# Patient Record
Sex: Female | Born: 1955 | Race: White | Hispanic: No | Marital: Married | Smoking: Never smoker
Health system: Southern US, Community
[De-identification: ages and names within clinical notes are randomized; demographics above are authoritative.]

## PROBLEM LIST (undated history)

## (undated) DIAGNOSIS — Z789 Other specified health status: Secondary | ICD-10-CM

---

## 2004-03-14 ENCOUNTER — Ambulatory Visit: Payer: Self-pay | Admitting: Unknown Physician Specialty

## 2005-04-16 ENCOUNTER — Ambulatory Visit: Payer: Self-pay | Admitting: Unknown Physician Specialty

## 2005-09-05 ENCOUNTER — Ambulatory Visit: Payer: Self-pay | Admitting: Unknown Physician Specialty

## 2005-09-29 ENCOUNTER — Other Ambulatory Visit: Payer: Self-pay

## 2005-10-09 ENCOUNTER — Ambulatory Visit: Payer: Self-pay | Admitting: Unknown Physician Specialty

## 2006-04-22 ENCOUNTER — Ambulatory Visit: Payer: Self-pay | Admitting: Unknown Physician Specialty

## 2006-07-01 ENCOUNTER — Ambulatory Visit: Payer: Self-pay | Admitting: Unknown Physician Specialty

## 2008-01-25 ENCOUNTER — Ambulatory Visit: Payer: Self-pay | Admitting: Unknown Physician Specialty

## 2009-04-27 ENCOUNTER — Ambulatory Visit: Payer: Self-pay | Admitting: Unknown Physician Specialty

## 2010-05-01 ENCOUNTER — Ambulatory Visit: Payer: Self-pay | Admitting: Unknown Physician Specialty

## 2011-05-13 ENCOUNTER — Ambulatory Visit: Payer: Self-pay | Admitting: Unknown Physician Specialty

## 2011-09-03 ENCOUNTER — Ambulatory Visit: Payer: Self-pay | Admitting: Urology

## 2011-09-26 ENCOUNTER — Ambulatory Visit: Payer: Self-pay | Admitting: Urology

## 2011-10-02 ENCOUNTER — Ambulatory Visit: Payer: Self-pay | Admitting: Urology

## 2011-10-08 ENCOUNTER — Ambulatory Visit: Payer: Self-pay | Admitting: Urology

## 2011-10-15 ENCOUNTER — Ambulatory Visit: Payer: Self-pay | Admitting: Urology

## 2012-06-11 ENCOUNTER — Ambulatory Visit: Payer: Self-pay | Admitting: Physician Assistant

## 2013-04-11 IMAGING — CT CT STONE STUDY
1 of 2 series · 15 of 32 positions shown, 19 images · non-contrast
Comparison: none

REASON FOR EXAM: kidney stones on xray hematuria renal colic
COMMENTS:

PROCEDURE:     KCT - KCT ABDOMEN/PELVIS WO (STONE)  - September 26, 2011  [DATE]
RESULT:     Comparison: None
TECHNIQUE: Multiple axial images from the lung bases to the symphysis pubis
were obtained without oral and without intravenous contrast.

[Series 2: abd 3mm wo 3.0 i40f 3 · axial · 0.98mm/px · z∈[-1140,-714]mm · 15 of 160 slices shown, 19 images]
[im 12/160  soft-tissue]
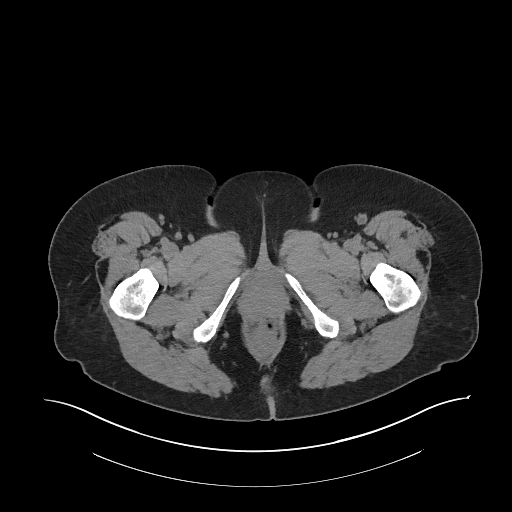
[im 12/160  bone]
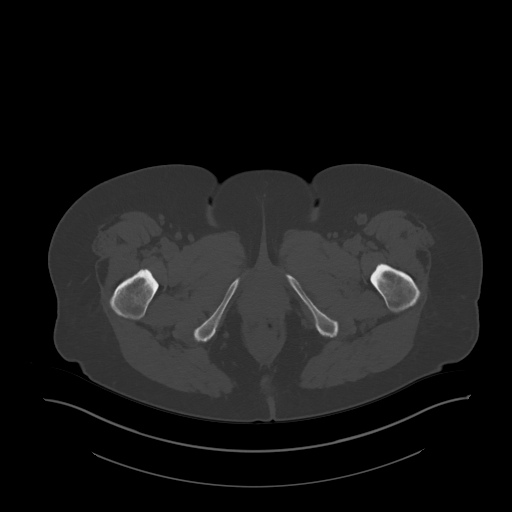
[im 24/160  soft-tissue]
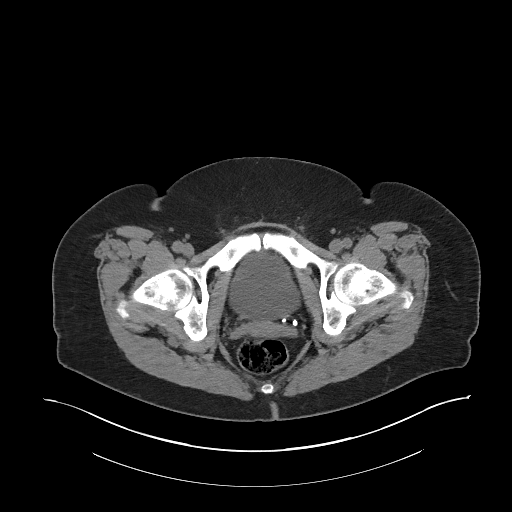
[im 36/160  soft-tissue]
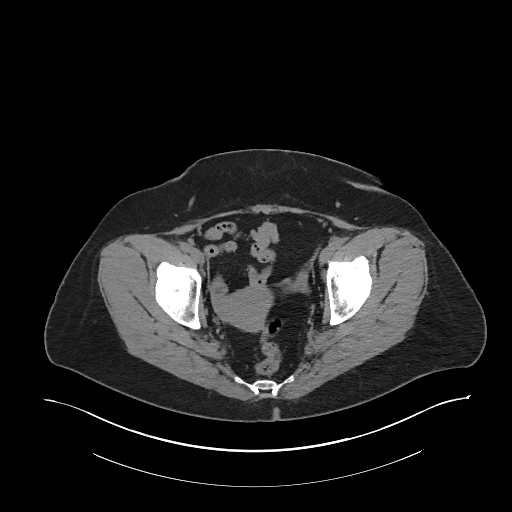
[im 48/160  soft-tissue]
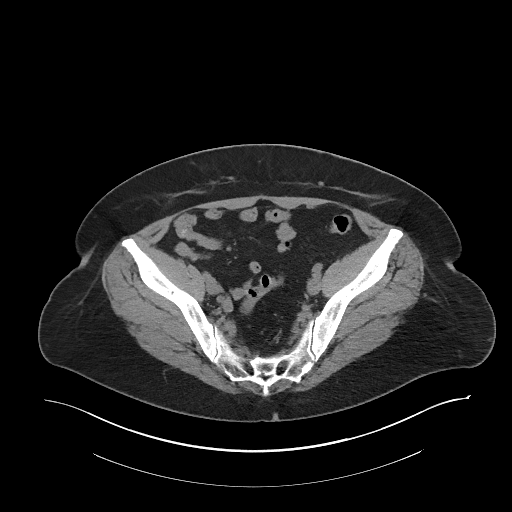
[im 59/160  soft-tissue]
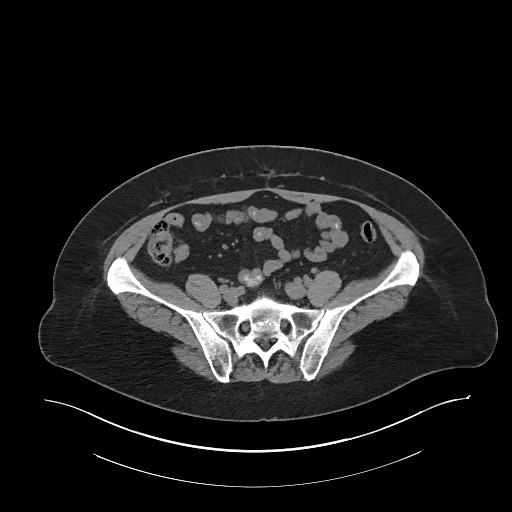
[im 71/160  soft-tissue]
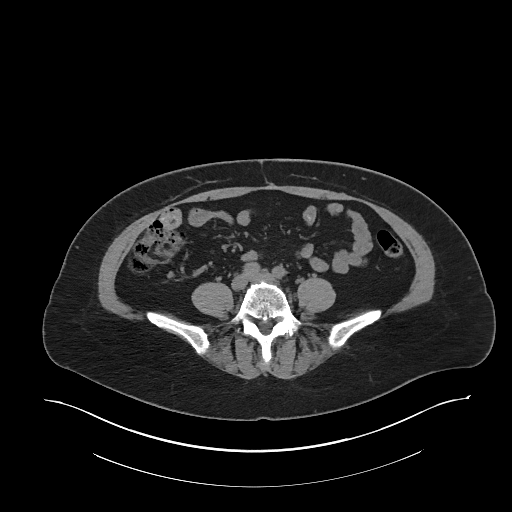
[im 83/160  soft-tissue]
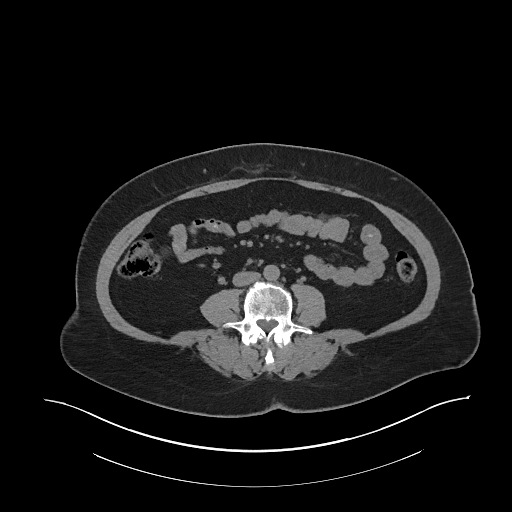
[im 95/160  soft-tissue]
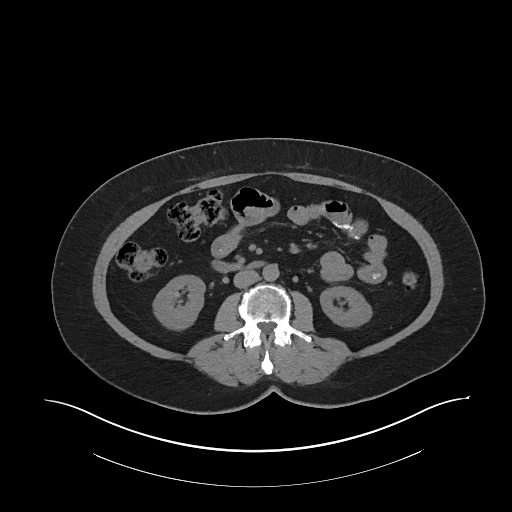
[im 107/160  soft-tissue]
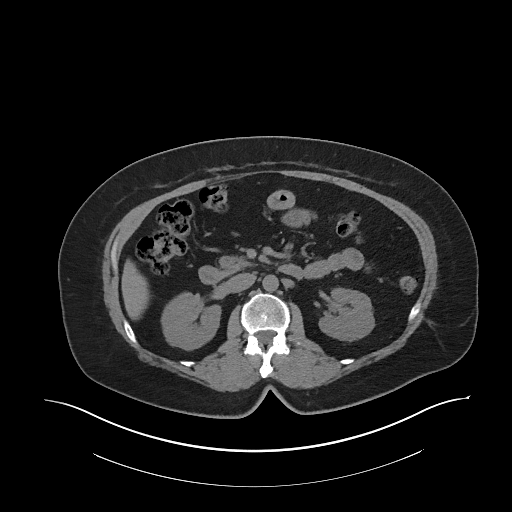
[im 107/160  bone]
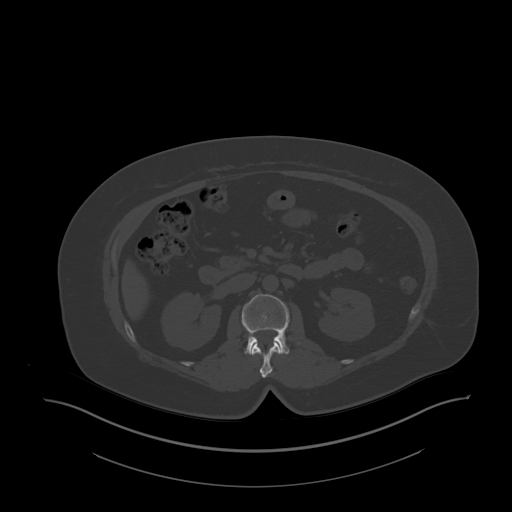
[im 118/160  soft-tissue]
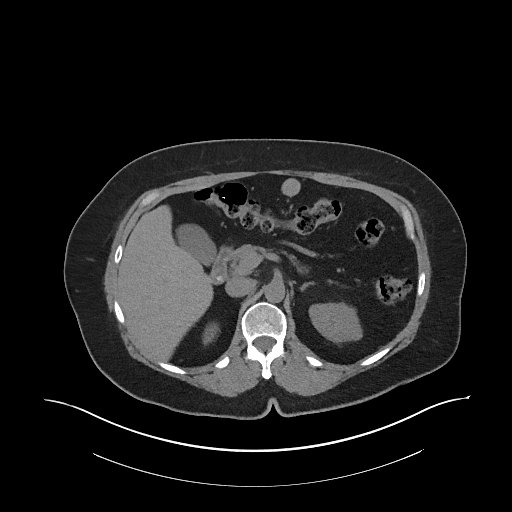
[im 130/160  soft-tissue]
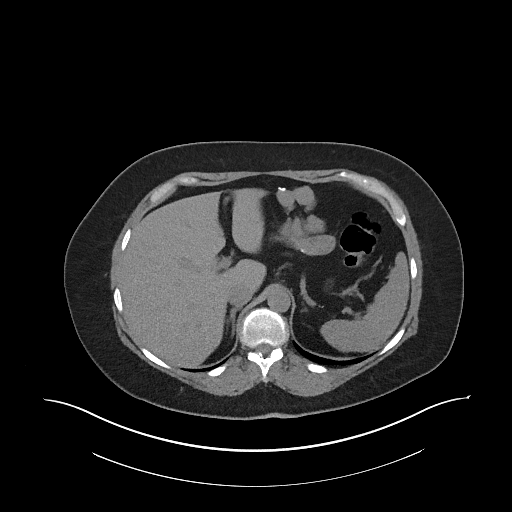
[im 136/160  lung]
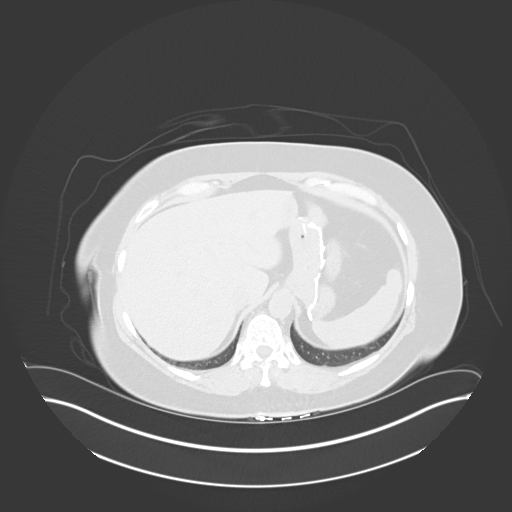
[im 142/160  soft-tissue]
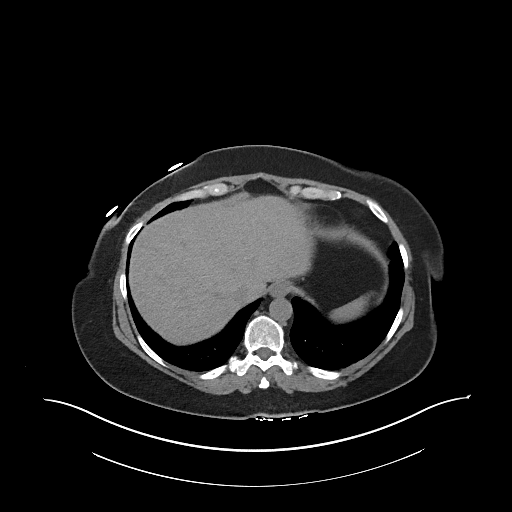
[im 142/160  lung]
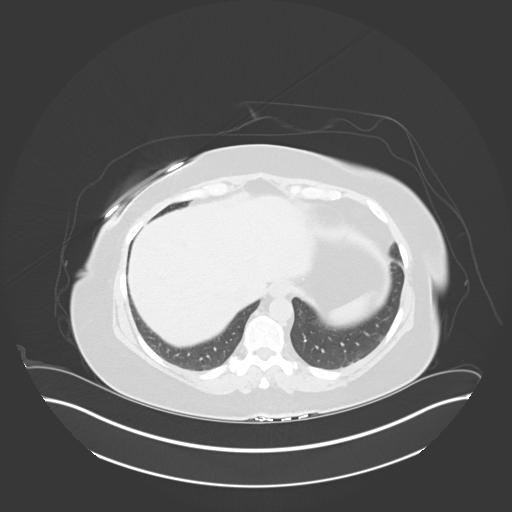
[im 148/160  lung]
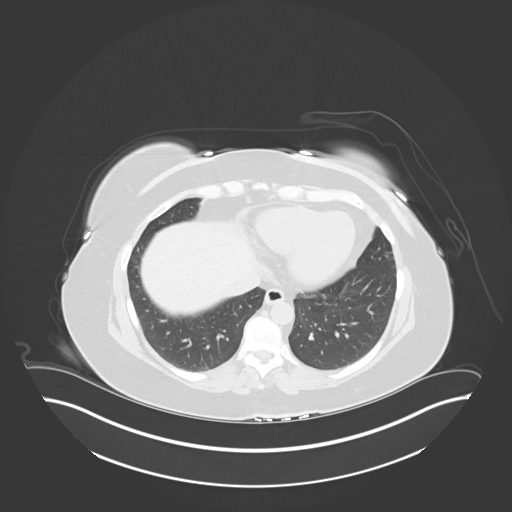
[im 154/160  soft-tissue]
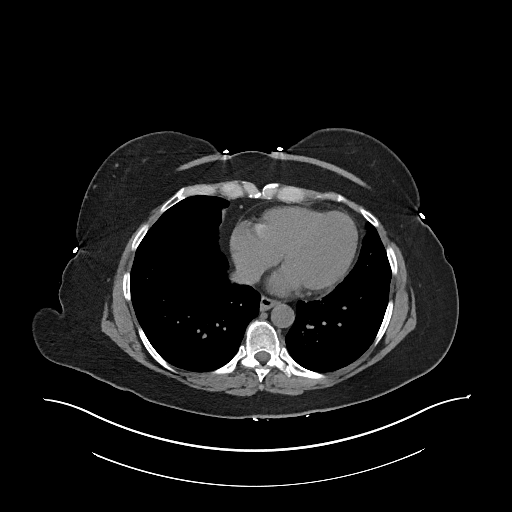
[im 154/160  lung]
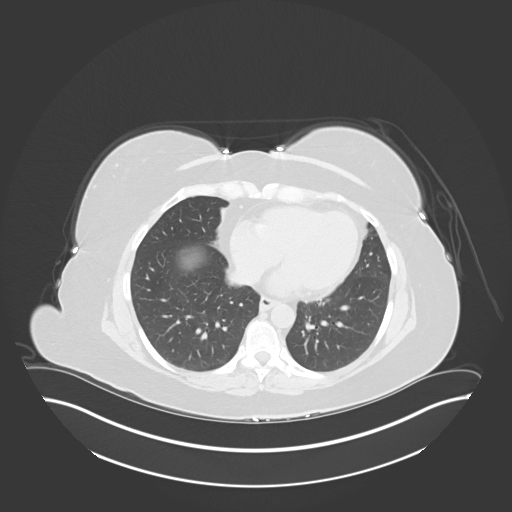

[15 of 32 positions shown; findings below may reference images not displayed]

FINDINGS: Lack of intravenous contrast limits evaluation of the solid abdominal
organs.  Grossly, the liver, spleen, adrenals, pancreas, and gallbladder are
unremarkable. Postoperative changes are seen likely from prior gastric
bypass. There are bilateral renal calculi, right greater than left. The
largest is seen in the right kidney. The largest measures 1.1 x 0.8 cm near
the renal pelvis the right kidney. There is no hydronephrosis or
ureterectasis. There is mild lobulation of the fetal contours bilaterally.

The small and large bowel are normal in caliber. There are a few diverticula
in the sigmoid colon. The appendix is normal. There is a focal area of
circumferential bowel wall thickening in the jejunal loop just distal to the
gastrojejunostomy, as seen on image 50. There are no adjacent inflammatory
changes. This is of uncertain etiology and clinical significance.

No aggressive lytic or sclerotic osseous lesions are identified.
IMPRESSION: 1. Bilateral nephrolithiasis, without hydronephrosis.
2. Focal circumferential bowel wall thickening in the jejunal loop just
distal to the gastrojejunostomy. This is nonspecific and could be infectious
or inflammatory. A neoplastic etiology would be difficult to exclude.
Followup CT of the abdomen and pelvis with oral contrast is suggested to
ensure resolution. Further evaluation with direct visualization may be
beneficial.

## 2013-04-23 IMAGING — CR DG ABDOMEN 1V
1 series · 1 of 1 positions shown · non-contrast
Comparison: none

REASON FOR EXAM: renal colic
COMMENTS:

[t abdomen supine]
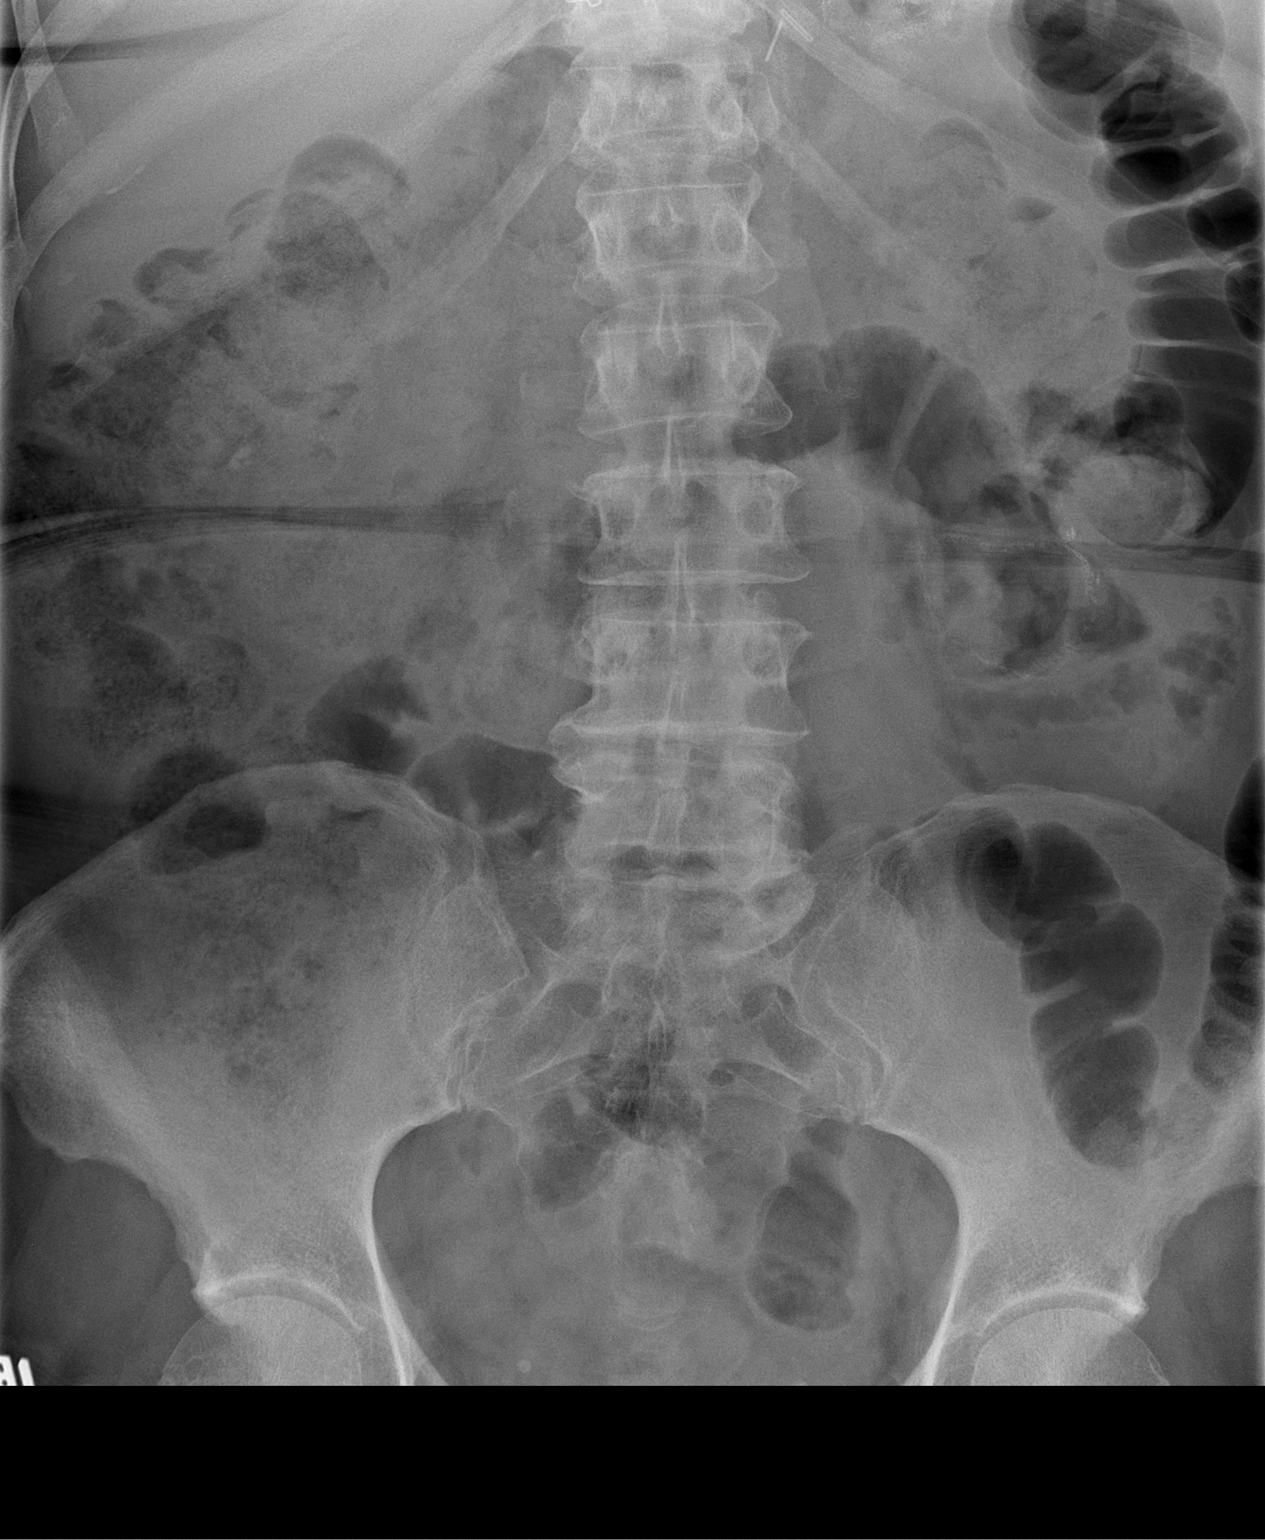

[1 of 1 positions shown; findings below may reference images not displayed]

PROCEDURE:     DXR - DXR KIDNEY URETER BLADDER  - October 08, 2011  [DATE]

RESULT:     Comparison is made to the study 03 September, 2011.

The bowel gas pattern demonstrates air and fecal material scattered through
the colon to the rectum was of air seen in loops of small bowel which are
not abnormally distended. The bowel content obscures the left kidney
predominantly in the majority the right kidney. There is some calcification
that appears to be fairly lateral in position which could represent
parenchymal calcification or density within the right renal collecting
system. A large calcification seen previously is not evident. There is a
tiny calcific density projecting just above the superior aspect of the right
sacroiliac joint which could represent a ureteral stone area phleboliths are
noted in the pelvis.
IMPRESSION: Probable right nephrolithiasis. A mid right ureteral
calculus should be considered and correlated clinically.

[REDACTED]

## 2013-04-30 IMAGING — CR DG ABDOMEN 1V
1 series · 2 of 2 positions shown · non-contrast
Comparison: none

REASON FOR EXAM: nephrolithiasis and renal colic post litho
COMMENTS:

[Series 1: supine kub · 0.17mm/px · 2 of 2 slices shown]
[im 1/2]
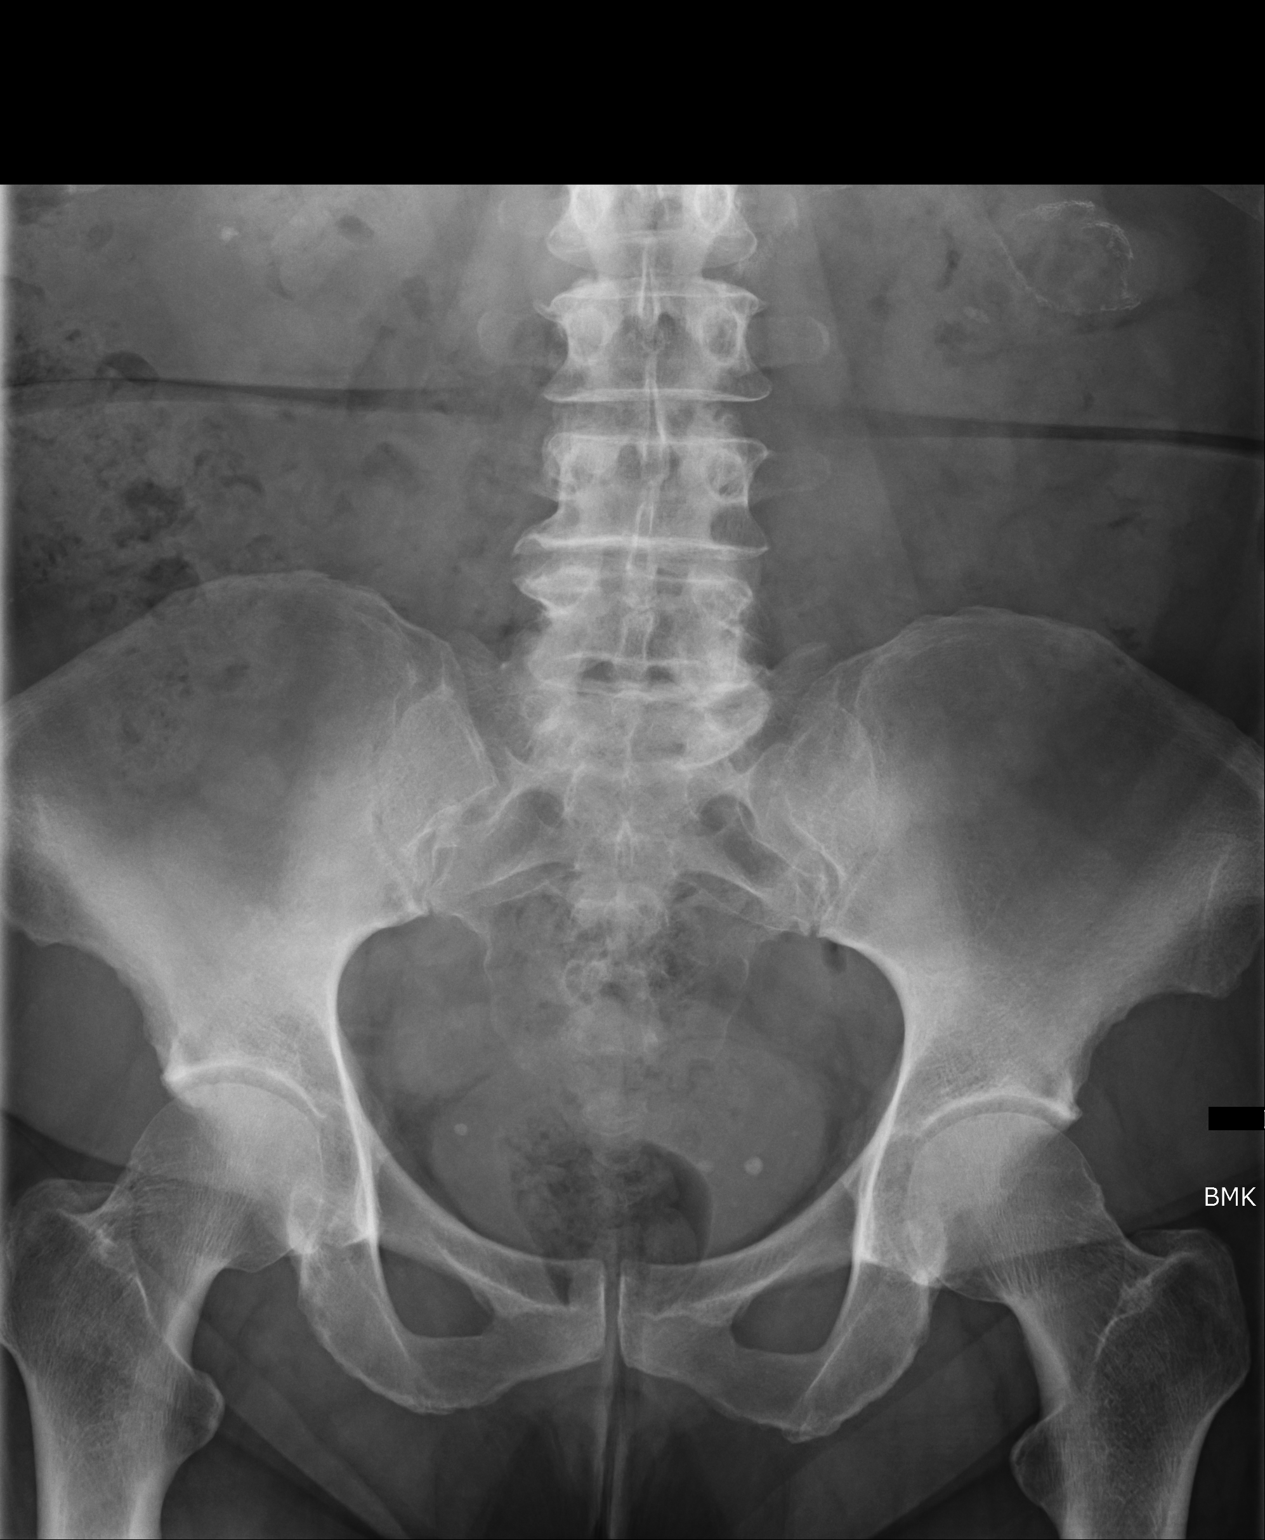
[im 2/2]
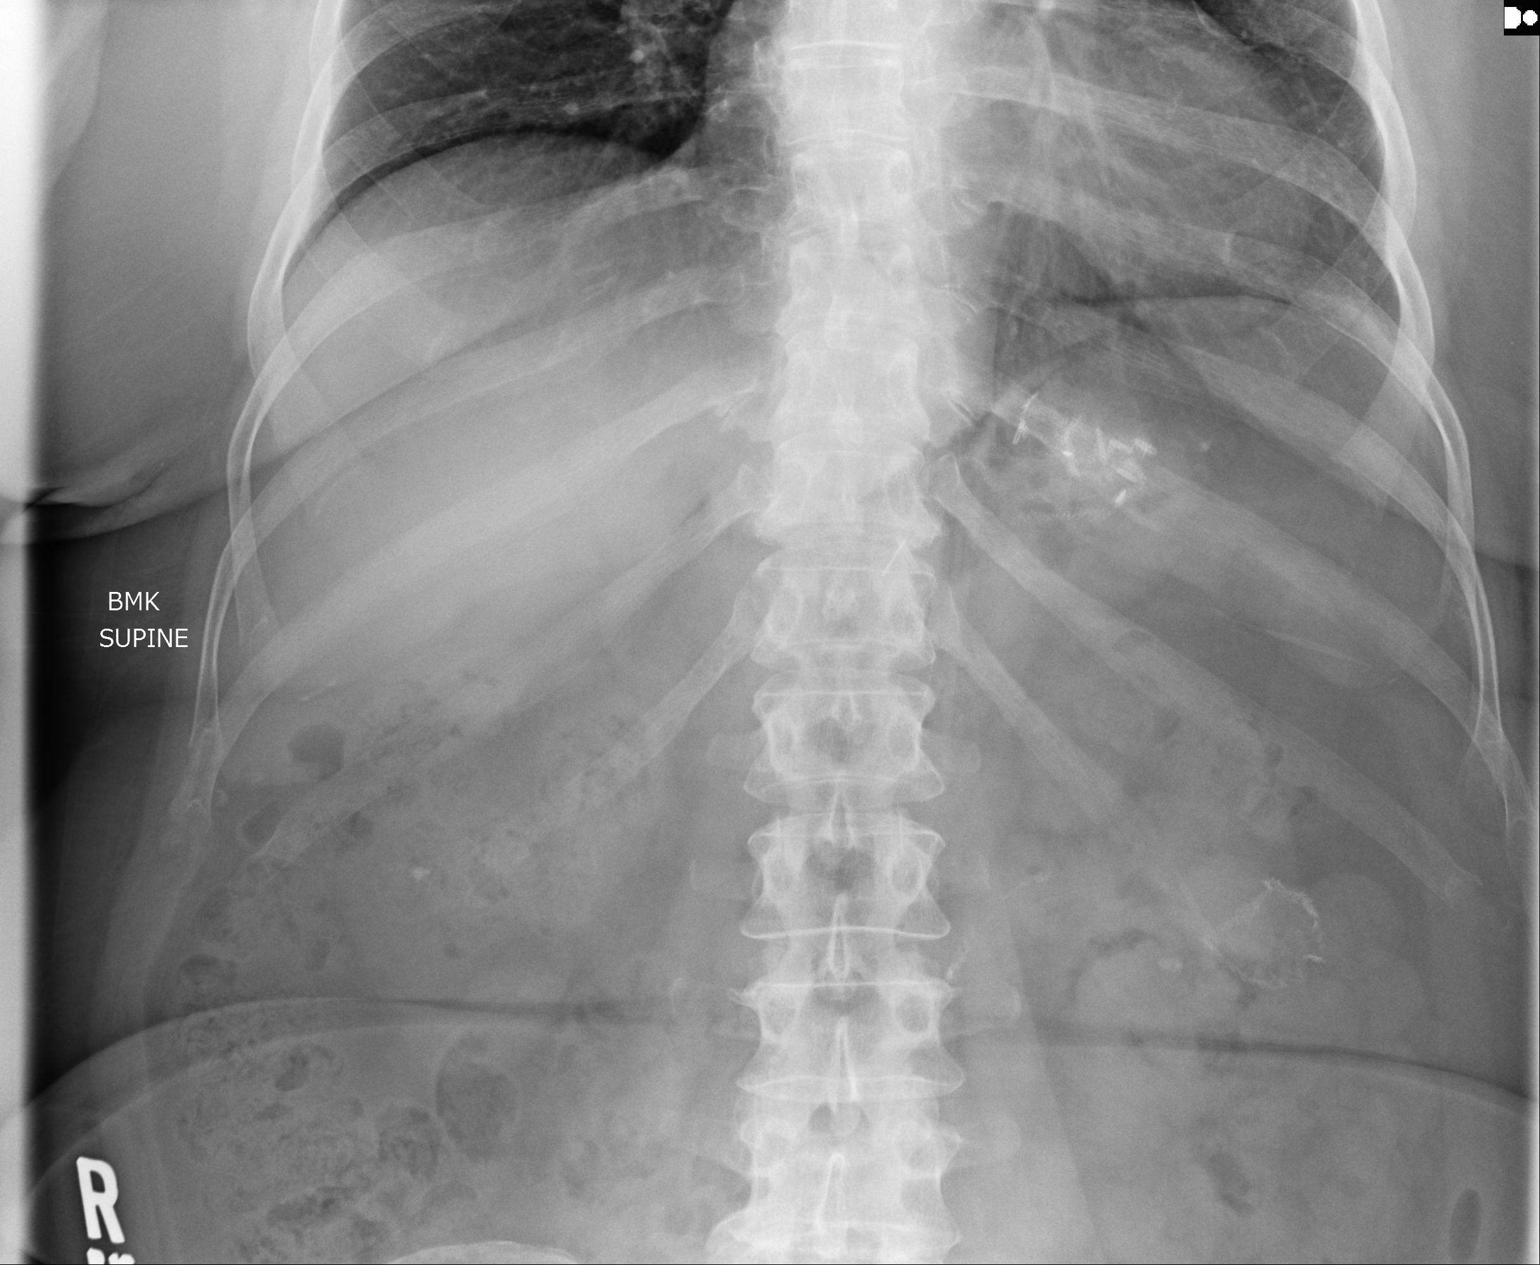

[2 of 2 positions shown; findings below may reference images not displayed]

PROCEDURE:     DXR - DXR KIDNEY URETER BLADDER  - October 15, 2011  [DATE]

RESULT:     Been bowel gas pattern suggests constipation. There is a stable
calcification projecting over the midpole of the right kidney. There are
phleboliths within the pelvis. There are surgical clips and suture material
in the left mid and upper abdomen. The bony structures exhibit no acute
abnormality.
IMPRESSION: There is at least one stone projecting over the midpole of
the right kidney. The stone measures approximately 3 mm in diameter and
appears smaller than on the previous study.

[REDACTED]

## 2014-03-13 ENCOUNTER — Ambulatory Visit: Payer: Self-pay | Admitting: Physician Assistant

## 2015-03-29 ENCOUNTER — Other Ambulatory Visit: Payer: Self-pay | Admitting: Physician Assistant

## 2015-03-29 DIAGNOSIS — Z1231 Encounter for screening mammogram for malignant neoplasm of breast: Secondary | ICD-10-CM

## 2015-04-06 ENCOUNTER — Ambulatory Visit
Admission: RE | Admit: 2015-04-06 | Discharge: 2015-04-06 | Disposition: A | Discharge status: Home / Self Care | Payer: BC Managed Care – PPO | Source: Ambulatory Visit | Attending: Physician Assistant | Admitting: Physician Assistant

## 2015-04-06 DIAGNOSIS — Z1231 Encounter for screening mammogram for malignant neoplasm of breast: Secondary | ICD-10-CM | POA: Diagnosis present

## 2016-03-06 ENCOUNTER — Other Ambulatory Visit: Payer: Self-pay | Admitting: Physician Assistant

## 2016-03-06 DIAGNOSIS — Z1231 Encounter for screening mammogram for malignant neoplasm of breast: Secondary | ICD-10-CM

## 2016-04-08 ENCOUNTER — Encounter: Payer: Self-pay | Admitting: Radiology

## 2016-04-08 ENCOUNTER — Ambulatory Visit
Admission: RE | Admit: 2016-04-08 | Discharge: 2016-04-08 | Disposition: A | Discharge status: Home / Self Care | Payer: BC Managed Care – PPO | Source: Ambulatory Visit | Attending: Physician Assistant | Admitting: Physician Assistant

## 2016-04-08 DIAGNOSIS — Z1231 Encounter for screening mammogram for malignant neoplasm of breast: Secondary | ICD-10-CM | POA: Insufficient documentation

## 2017-03-30 ENCOUNTER — Other Ambulatory Visit: Payer: Self-pay | Admitting: Physician Assistant

## 2017-03-30 DIAGNOSIS — Z1231 Encounter for screening mammogram for malignant neoplasm of breast: Secondary | ICD-10-CM

## 2017-04-17 ENCOUNTER — Ambulatory Visit
Admission: RE | Admit: 2017-04-17 | Discharge: 2017-04-17 | Disposition: A | Discharge status: Home / Self Care | Payer: BC Managed Care – PPO | Source: Ambulatory Visit | Attending: Physician Assistant | Admitting: Physician Assistant

## 2017-04-17 DIAGNOSIS — Z1231 Encounter for screening mammogram for malignant neoplasm of breast: Secondary | ICD-10-CM | POA: Diagnosis not present

## 2018-03-29 ENCOUNTER — Other Ambulatory Visit: Payer: Self-pay | Admitting: Physician Assistant

## 2018-03-29 DIAGNOSIS — Z1231 Encounter for screening mammogram for malignant neoplasm of breast: Secondary | ICD-10-CM

## 2018-04-09 ENCOUNTER — Other Ambulatory Visit: Payer: Self-pay | Admitting: Physician Assistant

## 2018-04-09 DIAGNOSIS — E010 Iodine-deficiency related diffuse (endemic) goiter: Secondary | ICD-10-CM

## 2018-04-09 DIAGNOSIS — Z0000 Encounter for general adult medical examination without abnormal findings: Secondary | ICD-10-CM

## 2018-04-09 DIAGNOSIS — E042 Nontoxic multinodular goiter: Secondary | ICD-10-CM

## 2018-04-15 ENCOUNTER — Other Ambulatory Visit: Payer: Self-pay

## 2018-04-15 ENCOUNTER — Ambulatory Visit
Admission: RE | Admit: 2018-04-15 | Discharge: 2018-04-15 | Disposition: A | Discharge status: Home / Self Care | Payer: BC Managed Care – PPO | Source: Ambulatory Visit | Attending: Physician Assistant | Admitting: Physician Assistant

## 2018-04-15 DIAGNOSIS — E042 Nontoxic multinodular goiter: Secondary | ICD-10-CM | POA: Diagnosis present

## 2018-04-15 DIAGNOSIS — E010 Iodine-deficiency related diffuse (endemic) goiter: Secondary | ICD-10-CM | POA: Insufficient documentation

## 2018-04-15 DIAGNOSIS — Z0000 Encounter for general adult medical examination without abnormal findings: Secondary | ICD-10-CM | POA: Insufficient documentation

## 2018-04-19 ENCOUNTER — Other Ambulatory Visit: Payer: Self-pay

## 2018-04-19 ENCOUNTER — Ambulatory Visit
Admission: RE | Admit: 2018-04-19 | Discharge: 2018-04-19 | Disposition: A | Discharge status: Home / Self Care | Payer: BC Managed Care – PPO | Source: Ambulatory Visit | Attending: Physician Assistant | Admitting: Physician Assistant

## 2018-04-19 DIAGNOSIS — Z1231 Encounter for screening mammogram for malignant neoplasm of breast: Secondary | ICD-10-CM | POA: Diagnosis present

## 2019-03-29 ENCOUNTER — Other Ambulatory Visit: Payer: Self-pay | Admitting: Physician Assistant

## 2019-03-29 DIAGNOSIS — Z1231 Encounter for screening mammogram for malignant neoplasm of breast: Secondary | ICD-10-CM

## 2019-04-22 ENCOUNTER — Ambulatory Visit
Admission: RE | Admit: 2019-04-22 | Discharge: 2019-04-22 | Disposition: A | Discharge status: Home / Self Care | Payer: Medicare PPO | Source: Ambulatory Visit | Attending: Physician Assistant | Admitting: Physician Assistant

## 2019-04-22 DIAGNOSIS — Z1231 Encounter for screening mammogram for malignant neoplasm of breast: Secondary | ICD-10-CM | POA: Insufficient documentation

## 2019-08-16 ENCOUNTER — Inpatient Hospital Stay
Admission: EM | Admit: 2019-08-16 | Discharge: 2019-08-20 | DRG: 177 | Disposition: A | Discharge status: Home / Self Care | Payer: Medicare PPO | Attending: Internal Medicine | Admitting: Internal Medicine

## 2019-08-16 ENCOUNTER — Other Ambulatory Visit: Payer: Self-pay

## 2019-08-16 ENCOUNTER — Encounter: Payer: Self-pay | Admitting: Emergency Medicine

## 2019-08-16 DIAGNOSIS — R7303 Prediabetes: Secondary | ICD-10-CM | POA: Diagnosis present

## 2019-08-16 DIAGNOSIS — J1282 Pneumonia due to coronavirus disease 2019: Secondary | ICD-10-CM | POA: Diagnosis present

## 2019-08-16 DIAGNOSIS — R0902 Hypoxemia: Secondary | ICD-10-CM | POA: Diagnosis present

## 2019-08-16 DIAGNOSIS — R739 Hyperglycemia, unspecified: Secondary | ICD-10-CM | POA: Diagnosis present

## 2019-08-16 DIAGNOSIS — U071 COVID-19: Secondary | ICD-10-CM | POA: Diagnosis present

## 2019-08-16 DIAGNOSIS — J9601 Acute respiratory failure with hypoxia: Secondary | ICD-10-CM | POA: Diagnosis present

## 2019-08-16 HISTORY — DX: Other specified health status: Z78.9

## 2019-08-16 LAB — CBC
HCT: 37.5 % (ref 36.0–46.0)
Hemoglobin: 12.6 g/dL (ref 12.0–15.0)
MCH: 28 pg (ref 26.0–34.0)
MCHC: 33.6 g/dL (ref 30.0–36.0)
MCV: 83.3 fL (ref 80.0–100.0)
Platelets: 372 10*3/uL (ref 150–400)
RBC: 4.5 MIL/uL (ref 3.87–5.11)
RDW: 13.5 % (ref 11.5–15.5)
WBC: 7.1 10*3/uL (ref 4.0–10.5)
nRBC: 0 % (ref 0.0–0.2)

## 2019-08-16 LAB — PROCALCITONIN: Procalcitonin: <0.1 ng/mL

## 2019-08-16 LAB — FIBRIN DERIVATIVES D-DIMER (ARMC ONLY): Fibrin derivatives D-dimer (ARMC): 1570.11 ng/mL (FEU) — ABNORMAL HIGH (ref 0.00–499.00)

## 2019-08-16 MED ORDER — ASCORBIC ACID 500 MG PO TABS
500.0000 mg | ORAL_TABLET | Freq: Every day | ORAL | Status: DC
  Administered 2019-08-16 – 2019-08-20 (×5): 500 mg via ORAL
  Filled 2019-08-16 (×5): qty 1

## 2019-08-16 MED ORDER — ZINC SULFATE 220 (50 ZN) MG PO CAPS
220.0000 mg | ORAL_CAPSULE | Freq: Every day | ORAL | Status: DC
  Administered 2019-08-16 – 2019-08-20 (×5): 220 mg via ORAL
  Filled 2019-08-16 (×5): qty 1

## 2019-08-16 MED ORDER — HYDROCOD POLST-CPM POLST ER 10-8 MG/5ML PO SUER: 5.0000 mL | Freq: Two times a day (BID) | ORAL | Status: DC | PRN

## 2019-08-16 MED ORDER — DEXAMETHASONE SODIUM PHOSPHATE 10 MG/ML IJ SOLN
12.0000 mg | Freq: Once | INTRAMUSCULAR | Status: AC
  Administered 2019-08-16: 20:00:00 12 mg via INTRAVENOUS
  Filled 2019-08-16: qty 2

## 2019-08-16 MED ORDER — ASPIRIN EC 81 MG PO TBEC
81.0000 mg | DELAYED_RELEASE_TABLET | Freq: Every day | ORAL | Status: DC
  Administered 2019-08-16 – 2019-08-20 (×5): 81 mg via ORAL
  Filled 2019-08-16 (×5): qty 1

## 2019-08-16 MED ORDER — SODIUM CHLORIDE 0.9% FLUSH
3.0000 mL | Freq: Two times a day (BID) | INTRAVENOUS | Status: DC
  Administered 2019-08-17 – 2019-08-20 (×7): 3 mL via INTRAVENOUS

## 2019-08-16 MED ORDER — SODIUM CHLORIDE 0.9 % IV SOLN
100.0000 mg | Freq: Every day | INTRAVENOUS | Status: AC
  Administered 2019-08-17 – 2019-08-20 (×4): 100 mg via INTRAVENOUS
  Filled 2019-08-16 (×4): qty 20

## 2019-08-16 MED ORDER — ALBUTEROL SULFATE HFA 108 (90 BASE) MCG/ACT IN AERS
2.0000 | INHALATION_SPRAY | Freq: Four times a day (QID) | RESPIRATORY_TRACT | Status: DC
  Administered 2019-08-16 – 2019-08-20 (×14): 2 via RESPIRATORY_TRACT
  Filled 2019-08-16: qty 6.7

## 2019-08-16 MED ORDER — SODIUM CHLORIDE 0.9% FLUSH: 3.0000 mL | INTRAVENOUS | Status: DC | PRN

## 2019-08-16 MED ORDER — ENOXAPARIN SODIUM 40 MG/0.4ML ~~LOC~~ SOLN
40.0000 mg | SUBCUTANEOUS | Status: DC
  Administered 2019-08-16 – 2019-08-19 (×4): 40 mg via SUBCUTANEOUS
  Filled 2019-08-16 (×4): qty 0.4

## 2019-08-16 MED ORDER — SODIUM CHLORIDE 0.9 % IV SOLN
200.0000 mg | Freq: Once | INTRAVENOUS | Status: AC
  Administered 2019-08-16: 20:00:00 200 mg via INTRAVENOUS
  Filled 2019-08-16: qty 40

## 2019-08-16 MED ORDER — DEXAMETHASONE SODIUM PHOSPHATE 10 MG/ML IJ SOLN
6.0000 mg | INTRAMUSCULAR | Status: DC
  Administered 2019-08-16 – 2019-08-19 (×4): 6 mg via INTRAVENOUS
  Filled 2019-08-16 (×4): qty 1

## 2019-08-16 MED ORDER — SODIUM CHLORIDE 0.9 % IV SOLN
250.0000 mL | INTRAVENOUS | Status: DC | PRN
  Administered 2019-08-17: 05:00:00 250 mL via INTRAVENOUS

## 2019-08-16 MED ORDER — GUAIFENESIN-DM 100-10 MG/5ML PO SYRP
10.0000 mL | ORAL_SOLUTION | ORAL | Status: DC | PRN
  Filled 2019-08-16: qty 10

## 2019-08-16 NOTE — ED Triage Notes (Addendum)
First nurse note- sent from PCP for PNA and hypoxia. Pt dx with covid 7/3.  sats 89% RA at check in. Next for triage. unlabored

## 2019-08-16 NOTE — Progress Notes (Signed)
Remdesivir - Pharmacy Brief Note   O:  Patient with recent visit to PCP for pneumonia s/t COVID. Tested positive 7/3 at walk in clinic. Labs from PCP WNL. SpO2: 93% on 4L [place]   A/P:  Remdesivir 200 mg IVPB once followed by 100 mg IVPB daily x 4 days.   Laureen Ochs, PharmD 08/16/2019 7:29 PM

## 2019-08-16 NOTE — ED Triage Notes (Signed)
Pt presents to ED via POV with c/o known bilateral pneumonia. Pt states has been coughing a lot, can't sleep, denies fever. Pt states dx with Covid on 7/3 at this time. RA sats 86% on RA upon arrival to triage.   Pt ambulatory, able to speak in complete sentences. Pt placed on 2L via [place] at this time.

## 2019-08-16 NOTE — ED Notes (Signed)
Pt had CBC and CMP performed at PCP and chest X-ray, results available for review in care everywhere. Per Dr. Derrill Kay, no repeat labs at this time.

## 2019-08-16 NOTE — ED Provider Notes (Signed)
[facility] Emergency Department Provider Note  ____________________________________________   I have reviewed the triage vital signs and the nursing notes.   HISTORY  Chief Complaint Shortness of Breath   History limited by: Not Limited   HPI Cassandra Terry is a 64 y.o. female who presents to the emergency department today because of concerns for shortness of breath in the setting of Covid.  The patient has positive for Covid 10 days ago.  She states that she has been having symptoms for the past 3 weeks.  It has gotten worse since testing +10 days ago.  She went to primary care physician's office today where she had blood work and chest x-ray done.  Chest x-ray was concerning for pneumonia.  Patient was found to be hypoxic.  Patient denies any underlying lung disease or history of asthma.  Records reviewed. Per medical record review patient has a history of visit to PCP today with blood work and cxr being performed.   History reviewed. No pertinent past medical history.  There are no problems to display for this patient.   History reviewed. No pertinent surgical history.  Prior to Admission medications   Not on File    Allergies Patient has no known allergies.  Family History  Problem Relation Age of Onset  . Breast cancer Maternal Aunt 68    Social History Social History   Tobacco Use  . Smoking status: Never Smoker  . Smokeless tobacco: Never Used  Substance Use Topics  . Alcohol use: Never  . Drug use: Never    Review of Systems Constitutional: Positive for low grade fevers.  Eyes: No visual changes. ENT: No sore throat. Cardiovascular: Denies chest pain. Respiratory: Positive for shortness of breath. Gastrointestinal: No abdominal pain.  No nausea, no vomiting.  No diarrhea.   Genitourinary: Negative for dysuria. Musculoskeletal: Positive for muscle aches. Skin: Negative for rash. Neurological: Negative for headaches, focal  weakness or numbness.  ____________________________________________   PHYSICAL EXAM:  VITAL SIGNS: ED Triage Vitals  Enc Vitals Group     BP 08/16/19 1809 110/72     Pulse Rate 08/16/19 1809 (!) 107     Resp 08/16/19 1809 (!) 26     Temp 08/16/19 1809 98.4 F (36.9 C)     Temp Source 08/16/19 1809 Oral     SpO2 08/16/19 1809 (!) 88 %     Weight 08/16/19 1810 186 lb (84.4 kg)     Height 08/16/19 1810 5\' 4"  (1.626 m)     Head Circumference --      Peak Flow --      Pain Score 08/16/19 1810 0    Constitutional: Alert and oriented.  Eyes: Conjunctivae are normal.  ENT      Head: Normocephalic and atraumatic.      Nose: No congestion/rhinnorhea.      Mouth/Throat: Mucous membranes are moist.      Neck: No stridor. Hematological/Lymphatic/Immunilogical: No cervical lymphadenopathy. Cardiovascular: Normal rate, regular rhythm.  No murmurs, rubs, or gallops.  Respiratory: Normal respiratory effort without tachypnea nor retractions. Breath sounds are clear and equal bilaterally. No wheezes/rales/rhonchi. Gastrointestinal: Soft and non tender. No rebound. No guarding.  Genitourinary: Deferred Musculoskeletal: Normal range of motion in all extremities. No lower extremity edema. Neurologic:  Normal speech and language. No gross focal neurologic deficits are appreciated.  Skin:  Skin is warm, dry and intact. No rash noted. Psychiatric: Mood and affect are normal. Speech and behavior are normal. Patient exhibits appropriate  insight and judgment.  ____________________________________________    LABS (pertinent positives/negatives)  Labwork from PCPs office reviewed.  Procalcitonin <0.10 ____________________________________________   EKG  I, Phineas Semen, attending physician, personally viewed and interpreted this EKG  EKG Time: 1810 Rate: 109 Rhythm: sinus tachycardia Axis: normal Intervals: qtc 439 QRS: narrow ST changes: no st elevation Impression: abnormal  ekg   ____________________________________________    RADIOLOGY  None  ____________________________________________   PROCEDURES  Procedures  ____________________________________________   INITIAL IMPRESSION / ASSESSMENT AND PLAN / ED COURSE  Pertinent labs & imaging results that were available during my care of the patient were reviewed by me and considered in my medical decision making (see chart for details).   Patient presented to the emergency department today because of concerns for shortness of breath and pneumonia in the setting of positive Covid.  Patient was noted to be hypoxic here.  She did feel better and her oxygen level did improve on nasal cannula.  Discussed with patient importance of admission for continued treatment.  Will write for patient to get steroids and remdesivir.  ____________________________________________   FINAL CLINICAL IMPRESSION(S) / ED DIAGNOSES  Final diagnoses:  COVID-19  Hypoxia     Note: This dictation was prepared with Dragon dictation. Any transcriptional errors that result from this process are unintentional     Phineas Semen, MD 08/16/19 2249

## 2019-08-16 NOTE — H&P (Signed)
History and Physical    [name] WSF:[id number] DOB: [birth year] DOA: 08/16/2019   PCP: Patrice Paradise, MD    Patient coming from: Home  Chief Complaint: Covid-19 PNA.  HPI: Cassandra Terry is a 64 y.o. female with no significant PMH seen in ed for sob that has been progressively getting worse. she started to have symptoms about 4 week prior to July 4th , where she has sob, fatigue, loss of appetite.Pt reports that today while at her moms pcp  appt she was seen as she was not feeling well and was tested with covid-19 pcr and chest ray was told she has covid-19 and low oxygen and sent to er. She has not been immunized.   ED Course:  In ed pt was hypoxic with oxygen sat of 88% on RA and tachycardic at 107.    Review of Systems: As per HPI otherwise 10 point review of systems negative.   Past Medical History:  Diagnosis Date  . Medical history non-contributory     History reviewed. No pertinent surgical history.   reports that she has never smoked. She has never used smokeless tobacco. She reports that she does not drink alcohol and does not use drugs.  No Known Allergies  Family History  Problem Relation Age of Onset  . Breast cancer Maternal Aunt 65    Physical Exam: Vitals:   08/16/19 1930 08/16/19 1951 08/16/19 1952 08/16/19 1953  BP: 137/79     Pulse: (!) 101 90    Resp:      Temp:      TempSrc:      SpO2:  94% 93% 94%  Weight:      Height:          Constitutional: NAD, calm, comfortable Vitals:   08/16/19 1930 08/16/19 1951 08/16/19 1952 08/16/19 1953  BP: 137/79     Pulse: (!) 101 90    Resp:      Temp:      TempSrc:      SpO2:  94% 93% 94%  Weight:      Height:       Eyes: PERRL, lids and conjunctivae normal ENMT: Mucous membranes are moist. Posterior pharynx clear of any exudate or lesions.Normal dentition.  Neck: normal, supple, no masses, no thyromegaly Respiratory: Scattered wheezing posteriorly.  Cardiovascular: Regular rate and  rhythm, no murmurs / rubs / gallops. No extremity edema. 2+ pedal pulses. No carotid bruits.  Abdomen: no tenderness, no masses palpated. No hepatosplenomegaly. Bowel sounds positive.  Musculoskeletal: no clubbing / cyanosis. No joint deformity upper and lower extremities. Good ROM, no contractures. Normal muscle tone.  Skin: no rashes, lesions, ulcers. No induration Neurologic: CN 2-12 grossly intact. Pt moving ext.  Psychiatric: Normal judgment and insight. Alert and oriented x 3. Normal mood.    Labs on Admission: I have personally reviewed following labs and imaging studies  CBC: No results for input(s): WBC, NEUTROABS, HGB, HCT, MCV, PLT in the last 168 hours. Basic Metabolic Panel: No results for input(s): NA, K, CL, CO2, GLUCOSE, BUN, CREATININE, CALCIUM, MG, PHOS in the last 168 hours. GFR: CrCl cannot be calculated (No successful lab value found.). Liver Function Tests: No results for input(s): AST, ALT, ALKPHOS, BILITOT, PROT, ALBUMIN in the last 168 hours. No results for input(s): LIPASE, AMYLASE in the last 168 hours. No results for input(s): AMMONIA in the last 168 hours. Coagulation Profile: No results for input(s): INR, PROTIME in the last 168 hours. Cardiac Enzymes:  No results for input(s): CKTOTAL, CKMB, CKMBINDEX, TROPONINI in the last 168 hours. BNP (last 3 results) No results for input(s): PROBNP in the last 8760 hours. HbA1C: No results for input(s): HGBA1C in the last 72 hours. CBG: No results for input(s): GLUCAP in the last 168 hours. Lipid Profile: No results for input(s): CHOL, HDL, LDLCALC, TRIG, CHOLHDL, LDLDIRECT in the last 72 hours. Thyroid Function Tests: No results for input(s): TSH, T4TOTAL, FREET4, T3FREE, THYROIDAB in the last 72 hours. Anemia Panel: No results for input(s): VITAMINB12, FOLATE, FERRITIN, TIBC, IRON, RETICCTPCT in the last 72 hours. Urine analysis: No results found for: COLORURINE, APPEARANCEUR, LABSPEC, PHURINE, GLUCOSEU,  HGBUR, BILIRUBINUR, KETONESUR, PROTEINUR, UROBILINOGEN, NITRITE, LEUKOCYTESUR   Radiological Exams on Admission: No results found.  EKG: Independently reviewed. None  Assessment/Plan Active Problems:   Acute hypoxemic respiratory failure due to COVID-19 [facility]) -We will admit tp in med-surg with tele and pulse oximetry. -supplemental oxygen as needed for goal sat above 93%. -T/t plan with Remdesivir loading and daily/ dexamethasone and vit c and zinc.  -droplet isolation.  -PRN inhalers and 6 min walk study prior to d/c for home oxygen eval.  DVT prophylaxis: lovenox Code Status: Full Family Communication: none at bedside Disposition Plan: Home Consults called: none Admission status: Inpateint   Gertha Calkin MD [facility] If 7PM-7AM, please contact night-coverage [email or web address] Password [facility]  08/16/2019, 9:59 PM

## 2019-08-17 ENCOUNTER — Inpatient Hospital Stay: Payer: Medicare PPO

## 2019-08-17 DIAGNOSIS — R0902 Hypoxemia: Secondary | ICD-10-CM

## 2019-08-17 LAB — COMPREHENSIVE METABOLIC PANEL
ALT: 33 U/L (ref 0–44)
AST: 32 U/L (ref 15–41)
Albumin: 3.1 g/dL — ABNORMAL LOW (ref 3.5–5.0)
Alkaline Phosphatase: 68 U/L (ref 38–126)
Anion gap: 11 (ref 5–15)
BUN: 17 mg/dL (ref 8–23)
CO2: 24 mmol/L (ref 22–32)
Calcium: 8.3 mg/dL — ABNORMAL LOW (ref 8.9–10.3)
Chloride: 100 mmol/L (ref 98–111)
Creatinine, Ser: 0.78 mg/dL (ref 0.44–1.00)
GFR calc Af Amer: >60 mL/min (ref >60)
GFR calc non Af Amer: >60 mL/min (ref >60)
Glucose, Bld: 217 mg/dL — ABNORMAL HIGH (ref 70–99)
Potassium: 3.8 mmol/L (ref 3.5–5.1)
Sodium: 135 mmol/L (ref 135–145)
Total Bilirubin: 0.8 mg/dL (ref 0.3–1.2)
Total Protein: 7.2 g/dL (ref 6.5–8.1)

## 2019-08-17 LAB — HEMOGLOBIN A1C
Hgb A1c MFr Bld: 6.4 % — ABNORMAL HIGH (ref 4.8–5.6)
Mean Plasma Glucose: 136.98 mg/dL

## 2019-08-17 LAB — C-REACTIVE PROTEIN
CRP: 9.8 mg/dL — ABNORMAL HIGH (ref <1.0)
CRP: 10 mg/dL — ABNORMAL HIGH (ref <1.0)

## 2019-08-17 LAB — MAGNESIUM: Magnesium: 2 mg/dL (ref 1.7–2.4)

## 2019-08-17 LAB — CREATININE, SERUM
Creatinine, Ser: 0.77 mg/dL (ref 0.44–1.00)
GFR calc Af Amer: >60 mL/min (ref >60)
GFR calc non Af Amer: >60 mL/min (ref >60)

## 2019-08-17 LAB — BASIC METABOLIC PANEL
Anion gap: 9 (ref 5–15)
BUN: 14 mg/dL (ref 8–23)
CO2: 25 mmol/L (ref 22–32)
Calcium: 8.3 mg/dL — ABNORMAL LOW (ref 8.9–10.3)
Chloride: 101 mmol/L (ref 98–111)
Creatinine, Ser: 0.79 mg/dL (ref 0.44–1.00)
GFR calc Af Amer: >60 mL/min (ref >60)
GFR calc non Af Amer: >60 mL/min (ref >60)
Glucose, Bld: 170 mg/dL — ABNORMAL HIGH (ref 70–99)
Potassium: 3.5 mmol/L (ref 3.5–5.1)
Sodium: 135 mmol/L (ref 135–145)

## 2019-08-17 LAB — BRAIN NATRIURETIC PEPTIDE: B Natriuretic Peptide: 43.5 pg/mL (ref 0.0–100.0)

## 2019-08-17 LAB — SARS CORONAVIRUS 2 BY RT PCR (HOSPITAL ORDER, PERFORMED IN ~~LOC~~ HOSPITAL LAB): SARS Coronavirus 2: POSITIVE — AB

## 2019-08-17 LAB — FIBRIN DERIVATIVES D-DIMER (ARMC ONLY): Fibrin derivatives D-dimer (ARMC): 1161.82 ng/mL (FEU) — ABNORMAL HIGH (ref 0.00–499.00)

## 2019-08-17 LAB — FERRITIN
Ferritin: 231 ng/mL (ref 11–307)
Ferritin: 219 ng/mL (ref 11–307)

## 2019-08-17 LAB — HIV ANTIBODY (ROUTINE TESTING W REFLEX): HIV Screen 4th Generation wRfx: NONREACTIVE

## 2019-08-17 LAB — ABO/RH: ABO/RH(D): B NEG

## 2019-08-17 LAB — GLUCOSE, CAPILLARY
Glucose-Capillary: 176 mg/dL — ABNORMAL HIGH (ref 70–99)
Glucose-Capillary: 204 mg/dL — ABNORMAL HIGH (ref 70–99)
Glucose-Capillary: 178 mg/dL — ABNORMAL HIGH (ref 70–99)

## 2019-08-17 LAB — PHOSPHORUS: Phosphorus: 4.3 mg/dL (ref 2.5–4.6)

## 2019-08-17 LAB — LACTATE DEHYDROGENASE: LDH: 378 U/L — ABNORMAL HIGH (ref 98–192)

## 2019-08-17 MED ORDER — INSULIN ASPART 100 UNIT/ML ~~LOC~~ SOLN
0.0000 [IU] | Freq: Three times a day (TID) | SUBCUTANEOUS | Status: DC
  Administered 2019-08-17: 5 [IU] via SUBCUTANEOUS
  Administered 2019-08-17 – 2019-08-19 (×4): 3 [IU] via SUBCUTANEOUS
  Administered 2019-08-19: 10:00:00 5 [IU] via SUBCUTANEOUS
  Administered 2019-08-20: 10:00:00 3 [IU] via SUBCUTANEOUS
  Filled 2019-08-17 (×7): qty 1

## 2019-08-17 MED ORDER — INSULIN ASPART 100 UNIT/ML ~~LOC~~ SOLN: 0.0000 [IU] | Freq: Every day | SUBCUTANEOUS | Status: DC

## 2019-08-17 NOTE — Plan of Care (Signed)

## 2019-08-17 NOTE — Progress Notes (Addendum)
PROGRESS NOTE    Cassandra Terry  WUJ:[id number] DOB: [birth year] DOA: 08/16/2019 PCP: Patrice Paradise, MD   Brief Narrative:  Cassandra Terry is a 64 y.o. female with no significant PMH seen in ED for SOB that has been progressively getting worse. she started to have symptoms about 4 week prior to July 4th , where she has sob, fatigue, loss of appetite.Pt reports that today while at her moms pcp  appt she was seen as she was not feeling well and was tested with covid-19 pcr and chest ray was told she has covid-19 and low oxygen and sent to ED. She has not been immunized.  She was hypoxic on arrival. She was started on remdesivir and steroids.  Subjective: Patient was less dyspneic when seen today.  She was eating her breakfast.  Stating that she was very stupid not to get vaccine.  Continue to have cough.  Assessment & Plan:   Active Problems:   Acute hypoxemic respiratory failure due to COVID-19 [facility])  Acute hypoxic respiratory failure secondary to COVID-19 pneumonia. Chest x-ray done this morning with bilateral opacities consistent with COVID-19 pneumonia.  Patient was saturating in mid 90s on 4 L.  Inflammatory markers elevated.  Procalcitonin negative. -Continue remdesivir-day 2. -Continue Decadron-day 2 -Continue supportive care with supplements and inhalers. -Try weaning her off from oxygen. -Continue to monitor inflammatory markers.  Hyperglycemia without the diagnosis of diabetes.  Blood glucose in 200s.  No prior diagnosis of diabetes.  A1c checked and it was 6.4 which makes her prediabetic.  Patient is on steroid. -Add SSI  Objective: Vitals:   08/17/19 0203 08/17/19 0721 08/17/19 1144 08/17/19 1147  BP: 123/78 120/60 104/61 (!) 95/50  Pulse: 76 75 84 80  Resp: 18 20 19 18   Temp: 97.6 F (36.4 C) 97.6 F (36.4 C) 97.6 F (36.4 C) 98 F (36.7 C)  TempSrc: Oral Oral Oral Oral  SpO2: 95% 94% 93% 94%  Weight:      Height:        Intake/Output Summary (Last 24  hours) at 08/17/2019 1412 Last data filed at 08/17/2019 0207 Gross per 24 hour  Intake --  Output 200 ml  Net -200 ml   Filed Weights   08/16/19 1810  Weight: 84.4 kg    Examination:  General exam: Appears calm and comfortable  Respiratory system: Few scattered rhonchi, respiratory effort normal. Cardiovascular system: S1 & S2 heard, RRR.  Gastrointestinal system: Soft, nontender, nondistended, bowel sounds positive. Central nervous system: Alert and oriented. No focal neurological deficits.Symmetric 5 x 5 power. Extremities: No edema, no cyanosis, pulses intact and symmetrical. Skin: No rashes, lesions or ulcers Psychiatry: Judgement and insight appear normal. Mood & affect appropriate.    DVT prophylaxis: Lovenox Code Status: Full Family Communication: No family at bedside Disposition Plan:  Status is: Inpatient  Remains inpatient appropriate because:Inpatient level of care appropriate due to severity of illness   Dispo: The patient is from: Home              Anticipated d/c is to: Home              Anticipated d/c date is: 3 days              Patient currently is not medically stable to d/c.  Patient will complete her remdesivir course on Saturday 08/20/19.  Patient is not interested in outpatient completion of therapy.  Consultants:   None  Procedures:  Antimicrobials:   Data  Reviewed: I have personally reviewed following labs and imaging studies  CBC: Recent Labs  Lab 08/16/19 2312  WBC 7.1  HGB 12.6  HCT 37.5  MCV 83.3  PLT 372   Basic Metabolic Panel: Recent Labs  Lab 08/16/19 2312 08/17/19 0437  NA 135 135  K 3.5 3.8  CL 101 100  CO2 25 24  GLUCOSE 170* 217*  BUN 14 17  CREATININE 0.79  0.77 0.78  CALCIUM 8.3* 8.3*  MG  --  2.0  PHOS  --  4.3   GFR: Estimated Creatinine Clearance: 73.7 mL/min (by C-G formula based on SCr of 0.78 mg/dL). Liver Function Tests: Recent Labs  Lab 08/17/19 0437  AST 32  ALT 33  ALKPHOS 68  BILITOT 0.8   PROT 7.2  ALBUMIN 3.1*   No results for input(s): LIPASE, AMYLASE in the last 168 hours. No results for input(s): AMMONIA in the last 168 hours. Coagulation Profile: No results for input(s): INR, PROTIME in the last 168 hours. Cardiac Enzymes: No results for input(s): CKTOTAL, CKMB, CKMBINDEX, TROPONINI in the last 168 hours. BNP (last 3 results) No results for input(s): PROBNP in the last 8760 hours. HbA1C: Recent Labs    08/17/19 0437  HGBA1C 6.4*   CBG: Recent Labs  Lab 08/17/19 0839  GLUCAP 176*   Lipid Profile: No results for input(s): CHOL, HDL, LDLCALC, TRIG, CHOLHDL, LDLDIRECT in the last 72 hours. Thyroid Function Tests: No results for input(s): TSH, T4TOTAL, FREET4, T3FREE, THYROIDAB in the last 72 hours. Anemia Panel: Recent Labs    08/16/19 2312 08/17/19 0437  FERRITIN 231 219   Sepsis Labs: Recent Labs  Lab 08/16/19 1951  PROCALCITON <0.10    Recent Results (from the past 240 hour(s))  SARS Coronavirus 2 by RT PCR (hospital order, performed in [facility] hospital lab) Nasopharyngeal Nasopharyngeal Swab     Status: Abnormal   Collection Time: 08/16/19 11:12 PM   Specimen: Nasopharyngeal Swab  Result Value Ref Range Status   SARS Coronavirus 2 POSITIVE (A) NEGATIVE Final    Comment: CRITICAL RESULT CALLED TO, READ BACK BY AND VERIFIED WITH: Victorino Sparrow @ 0216 ON 7/142021 RH (NOTE) SARS-CoV-2 target nucleic acids are DETECTED  SARS-CoV-2 RNA is generally detectable in upper respiratory specimens  during the acute phase of infection.  Positive results are indicative  of the presence of the identified virus, but do not rule out bacterial infection or co-infection with other pathogens not detected by the test.  Clinical correlation with patient history and  other diagnostic information is necessary to determine patient infection status.  The expected result is negative.  Fact Sheet for Patients:    BoilerBrush.com.cy   Fact Sheet for Healthcare Providers:   https://pope.com/    This test is not yet approved or cleared by the [place] FDA and  has been authorized for detection and/or diagnosis of SARS-CoV-2 by FDA under an Emergency Use Authorization (EUA).  This EUA will remain in effect (meani ng this test can be used) for the duration of  the COVID-19 declaration under Section 564(b)(1) of the Act, 21 U.S.C. section 360-bbb-3(b)(1), unless the authorization is terminated or revoked sooner.  Performed at [facility], [address] [place], [place] [address]      Radiology Studies: [facility] Chest [place] 1 View  Result Date: 08/17/2019 CLINICAL DATA:  Hypoxia, COVID positive EXAM: PORTABLE CHEST 1 VIEW COMPARISON:  2007 FINDINGS: Patchy bilateral opacities. No pleural effusion or pneumothorax. Normal heart size. IMPRESSION: Patchy  bilateral opacities likely reflecting COVID-19 pneumonia. Electronically Signed   By: Guadlupe Spanish M.D.   On: 08/17/2019 09:13    Scheduled Meds: . albuterol  2 puff Inhalation Q6H  . vitamin C  500 mg Oral Daily  . aspirin EC  81 mg Oral Daily  . dexamethasone (DECADRON) injection  6 mg Intravenous Q24H  . enoxaparin (LOVENOX) injection  40 mg Subcutaneous Q24H  . insulin aspart  0-15 Units Subcutaneous TID WC  . insulin aspart  0-5 Units Subcutaneous QHS  . sodium chloride flush  3 mL Intravenous Q12H  . zinc sulfate  220 mg Oral Daily   Continuous Infusions: . sodium chloride 250 mL (08/17/19 0459)  . remdesivir 100 mg in NS 100 mL 100 mg (08/17/19 1136)     LOS: 1 day   Time spent: 35 minutes.  Arnetha Courser, MD [facility]  If 7PM-7AM, please contact night-coverage [email or web address]  08/17/2019, 2:12 PM   This record has been created using Conservation officer, historic buildings. Errors have been sought and corrected,but may not always be located. Such creation errors do  not reflect on the standard of care.

## 2019-08-17 NOTE — Progress Notes (Signed)
Report called to [facility], RN

## 2019-08-18 LAB — COMPREHENSIVE METABOLIC PANEL
ALT: 30 U/L (ref 0–44)
AST: 23 U/L (ref 15–41)
Albumin: 2.8 g/dL — ABNORMAL LOW (ref 3.5–5.0)
Alkaline Phosphatase: 59 U/L (ref 38–126)
Anion gap: 9 (ref 5–15)
BUN: 19 mg/dL (ref 8–23)
CO2: 24 mmol/L (ref 22–32)
Calcium: 8.4 mg/dL — ABNORMAL LOW (ref 8.9–10.3)
Chloride: 103 mmol/L (ref 98–111)
Creatinine, Ser: 0.7 mg/dL (ref 0.44–1.00)
GFR calc Af Amer: >60 mL/min (ref >60)
GFR calc non Af Amer: >60 mL/min (ref >60)
Glucose, Bld: 201 mg/dL — ABNORMAL HIGH (ref 70–99)
Potassium: 3.8 mmol/L (ref 3.5–5.1)
Sodium: 136 mmol/L (ref 135–145)
Total Bilirubin: 0.6 mg/dL (ref 0.3–1.2)
Total Protein: 6.6 g/dL (ref 6.5–8.1)

## 2019-08-18 LAB — PHOSPHORUS: Phosphorus: 3.1 mg/dL (ref 2.5–4.6)

## 2019-08-18 LAB — GLUCOSE, CAPILLARY
Glucose-Capillary: 170 mg/dL — ABNORMAL HIGH (ref 70–99)
Glucose-Capillary: 151 mg/dL — ABNORMAL HIGH (ref 70–99)
Glucose-Capillary: 115 mg/dL — ABNORMAL HIGH (ref 70–99)
Glucose-Capillary: 121 mg/dL — ABNORMAL HIGH (ref 70–99)

## 2019-08-18 LAB — MAGNESIUM: Magnesium: 2.1 mg/dL (ref 1.7–2.4)

## 2019-08-18 LAB — C-REACTIVE PROTEIN: CRP: 3.3 mg/dL — ABNORMAL HIGH (ref <1.0)

## 2019-08-18 LAB — FIBRIN DERIVATIVES D-DIMER (ARMC ONLY): Fibrin derivatives D-dimer (ARMC): 646.5 ng/mL (FEU) — ABNORMAL HIGH (ref 0.00–499.00)

## 2019-08-18 LAB — FERRITIN: Ferritin: 221 ng/mL (ref 11–307)

## 2019-08-18 NOTE — Progress Notes (Signed)
PROGRESS NOTE    Cassandra Terry  QVZ:[id number] DOB: 06/29/1955 DOA: 08/16/2019 PCP: Patrice Paradise, MD   Brief Narrative:  Cassandra Terry is a 64 y.o. female with no significant PMH seen in ED for SOB that has been progressively getting worse. she started to have symptoms about 4 week prior to July 4th , where she has sob, fatigue, loss of appetite.Pt reports that today while at her moms pcp  appt she was seen as she was not feeling well and was tested with covid-19 pcr and chest ray was told she has covid-19 and low oxygen and sent to ED. She has not been immunized.  She was hypoxic on arrival. She was started on remdesivir and steroids.  Subjective: Patient was feeling little better when seen today.  Continues to become more dyspneic with moving around.  Cough is improving.  Assessment & Plan:   Active Problems:   COVID-19   Hypoxia  Acute hypoxic respiratory failure secondary to COVID-19 pneumonia. Chest x-ray done this morning with bilateral opacities consistent with COVID-19 pneumonia.  Patient was saturating in mid 90s on 4 L.  Inflammatory markers elevated.  Procalcitonin negative. -Continue remdesivir-day 3. -Continue Decadron-day 3 -Continue supportive care with supplements and inhalers. -Try weaning her off from oxygen. -Continue to monitor inflammatory markers.  Hyperglycemia without the diagnosis of diabetes.  Blood glucose in 200s.  No prior diagnosis of diabetes.  A1c checked and it was 6.4 which makes her prediabetic.  Patient is on steroid. -Continue SSI  Objective: Vitals:   08/18/19 0332 08/18/19 0854 08/18/19 1254 08/18/19 1712  BP: 123/78 136/67 122/64 118/86  Pulse: 73 68 79 72  Resp: 18 16 20 16   Temp: 97.9 F (36.6 C) 98.7 F (37.1 C) 97.9 F (36.6 C) 98.8 F (37.1 C)  TempSrc: Oral Oral Oral Oral  SpO2: 93% 92% (!) 89% 94%  Weight:      Height:        Intake/Output Summary (Last 24 hours) at 08/18/2019 1748 Last data filed at 08/17/2019  1800 Gross per 24 hour  Intake 240 ml  Output --  Net 240 ml   Filed Weights   08/16/19 1810  Weight: 84.4 kg    Examination:  General exam: Well-developed lady, in no acute distress. Respiratory system: Clear bilaterally, respiratory effort normal. Cardiovascular system: S1 & S2 heard, RRR.  Gastrointestinal system: Soft, nontender, nondistended, bowel sounds positive. Central nervous system: Alert and oriented. No focal neurological deficits. Extremities: No edema, no cyanosis, pulses intact and symmetrical. Skin: No rashes, lesions or ulcers Psychiatry: Judgement and insight appear normal. Mood & affect appropriate.    DVT prophylaxis: Lovenox Code Status: Full Family Communication: No family at bedside Disposition Plan:  Status is: Inpatient  Remains inpatient appropriate because:Inpatient level of care appropriate due to severity of illness   Dispo: The patient is from: Home              Anticipated d/c is to: Home              Anticipated d/c date is: 3 days              Patient currently is not medically stable to d/c.  Patient will complete her remdesivir course on Saturday 08/20/19.  Patient is not interested in outpatient completion of therapy.  Consultants:   None  Procedures:  Antimicrobials:   Data Reviewed: I have personally reviewed following labs and imaging studies  CBC: Recent Labs  Lab  08/16/19 2312  WBC 7.1  HGB 12.6  HCT 37.5  MCV 83.3  PLT 372   Basic Metabolic Panel: Recent Labs  Lab 08/16/19 2312 08/17/19 0437 08/18/19 0535  NA 135 135 136  K 3.5 3.8 3.8  CL 101 100 103  CO2 25 24 24   GLUCOSE 170* 217* 201*  BUN 14 17 19   CREATININE 0.79   0.77 0.78 0.70  CALCIUM 8.3* 8.3* 8.4*  MG  --  2.0 2.1  PHOS  --  4.3 3.1   GFR: Estimated Creatinine Clearance: 73.7 mL/min (by C-G formula based on SCr of 0.7 mg/dL). Liver Function Tests: Recent Labs  Lab 08/17/19 0437 08/18/19 0535  AST 32 23  ALT 33 30  ALKPHOS 68 59   BILITOT 0.8 0.6  PROT 7.2 6.6  ALBUMIN 3.1* 2.8*   No results for input(s): LIPASE, AMYLASE in the last 168 hours. No results for input(s): AMMONIA in the last 168 hours. Coagulation Profile: No results for input(s): INR, PROTIME in the last 168 hours. Cardiac Enzymes: No results for input(s): CKTOTAL, CKMB, CKMBINDEX, TROPONINI in the last 168 hours. BNP (last 3 results) No results for input(s): PROBNP in the last 8760 hours. HbA1C: Recent Labs    08/17/19 0437  HGBA1C 6.4*   CBG: Recent Labs  Lab 08/17/19 1637 08/17/19 2028 08/18/19 0853 08/18/19 1253 08/18/19 1713  GLUCAP 204* 178* 170* 151* 115*   Lipid Profile: No results for input(s): CHOL, HDL, LDLCALC, TRIG, CHOLHDL, LDLDIRECT in the last 72 hours. Thyroid Function Tests: No results for input(s): TSH, T4TOTAL, FREET4, T3FREE, THYROIDAB in the last 72 hours. Anemia Panel: Recent Labs    08/17/19 0437 08/18/19 0535  FERRITIN 219 221   Sepsis Labs: Recent Labs  Lab 08/16/19 1951  PROCALCITON <0.10    Recent Results (from the past 240 hour(s))  SARS Coronavirus 2 by RT PCR (hospital order, performed in [facility] hospital lab) Nasopharyngeal Nasopharyngeal Swab     Status: Abnormal   Collection Time: 08/16/19 11:12 PM   Specimen: Nasopharyngeal Swab  Result Value Ref Range Status   SARS Coronavirus 2 POSITIVE (A) NEGATIVE Final    Comment: CRITICAL RESULT CALLED TO, READ BACK BY AND VERIFIED WITH: [facility] @ 0216 ON 7/142021 RH (NOTE) SARS-CoV-2 target nucleic acids are DETECTED  SARS-CoV-2 RNA is generally detectable in upper respiratory specimens  during the acute phase of infection.  Positive results are indicative  of the presence of the identified virus, but do not rule out bacterial infection or co-infection with other pathogens not detected by the test.  Clinical correlation with patient history and  other diagnostic information is necessary to determine patient infection status.  The  expected result is negative.  Fact Sheet for Patients:   Victorino Sparrow   Fact Sheet for Healthcare Providers:   10-01-1986    This test is not yet approved or cleared by the [email or web address] FDA and  has been authorized for detection and/or diagnosis of SARS-CoV-2 by FDA under an Emergency Use Authorization (EUA).  This EUA will remain in effect (meani ng this test can be used) for the duration of  the COVID-19 declaration under Section 564(b)(1) of the Act, 21 U.S.C. section 360-bbb-3(b)(1), unless the authorization is terminated or revoked sooner.  Performed at [facility], [address], [place], [address] [place]      Radiology Studies: [facility] Chest [place] 1 View  Result Date: 08/17/2019 CLINICAL DATA:  Hypoxia, COVID positive EXAM: PORTABLE CHEST 1 VIEW  COMPARISON:  2007 FINDINGS: Patchy bilateral opacities. No pleural effusion or pneumothorax. Normal heart size. IMPRESSION: Patchy bilateral opacities likely reflecting COVID-19 pneumonia. Electronically Signed   By: Guadlupe Spanish M.D.   On: 08/17/2019 09:13    Scheduled Meds:  albuterol  2 puff Inhalation Q6H   vitamin C  500 mg Oral Daily   aspirin EC  81 mg Oral Daily   dexamethasone (DECADRON) injection  6 mg Intravenous Q24H   enoxaparin (LOVENOX) injection  40 mg Subcutaneous Q24H   insulin aspart  0-15 Units Subcutaneous TID WC   insulin aspart  0-5 Units Subcutaneous QHS   sodium chloride flush  3 mL Intravenous Q12H   zinc sulfate  220 mg Oral Daily   Continuous Infusions:  sodium chloride Stopped (08/17/19 0500)   remdesivir 100 mg in NS 100 mL 100 mg (08/18/19 1037)     LOS: 2 days   Time spent: 30 minutes.  Arnetha Courser, MD [facility]  If 7PM-7AM, please contact night-coverage [email or web address]  08/18/2019, 5:48 PM   This record has been created using Conservation officer, historic buildings. Errors have been sought and  corrected,but may not always be located. Such creation errors do not reflect on the standard of care.

## 2019-08-19 LAB — FERRITIN: Ferritin: 174 ng/mL (ref 11–307)

## 2019-08-19 LAB — COMPREHENSIVE METABOLIC PANEL
ALT: 28 U/L (ref 0–44)
AST: 23 U/L (ref 15–41)
Albumin: 2.8 g/dL — ABNORMAL LOW (ref 3.5–5.0)
Alkaline Phosphatase: 55 U/L (ref 38–126)
Anion gap: 7 (ref 5–15)
BUN: 22 mg/dL (ref 8–23)
CO2: 27 mmol/L (ref 22–32)
Calcium: 8.2 mg/dL — ABNORMAL LOW (ref 8.9–10.3)
Chloride: 104 mmol/L (ref 98–111)
Creatinine, Ser: 0.64 mg/dL (ref 0.44–1.00)
GFR calc Af Amer: >60 mL/min (ref >60)
GFR calc non Af Amer: >60 mL/min (ref >60)
Glucose, Bld: 227 mg/dL — ABNORMAL HIGH (ref 70–99)
Potassium: 4.2 mmol/L (ref 3.5–5.1)
Sodium: 138 mmol/L (ref 135–145)
Total Bilirubin: 0.7 mg/dL (ref 0.3–1.2)
Total Protein: 6.4 g/dL — ABNORMAL LOW (ref 6.5–8.1)

## 2019-08-19 LAB — PHOSPHORUS: Phosphorus: 3.3 mg/dL (ref 2.5–4.6)

## 2019-08-19 LAB — MAGNESIUM: Magnesium: 2.1 mg/dL (ref 1.7–2.4)

## 2019-08-19 LAB — GLUCOSE, CAPILLARY
Glucose-Capillary: 240 mg/dL — ABNORMAL HIGH (ref 70–99)
Glucose-Capillary: 187 mg/dL — ABNORMAL HIGH (ref 70–99)
Glucose-Capillary: 108 mg/dL — ABNORMAL HIGH (ref 70–99)
Glucose-Capillary: 129 mg/dL — ABNORMAL HIGH (ref 70–99)

## 2019-08-19 LAB — FIBRIN DERIVATIVES D-DIMER (ARMC ONLY): Fibrin derivatives D-dimer (ARMC): 533.33 ng/mL (FEU) — ABNORMAL HIGH (ref 0.00–499.00)

## 2019-08-19 LAB — C-REACTIVE PROTEIN: CRP: 1.4 mg/dL — ABNORMAL HIGH (ref <1.0)

## 2019-08-19 NOTE — Care Management Important Message (Signed)
Important Message  Patient Details  Name: BRUNILDA EBLE MRN: [id number] Date of Birth: [birth year]   Medicare Important Message Given:  Yes     Allayne Butcher, RN 08/19/2019, 2:21 PM

## 2019-08-19 NOTE — Progress Notes (Signed)
PROGRESS NOTE    Cassandra Terry  MHD:[id number] DOB: 07-14-1955 DOA: 08/16/2019 PCP: Patrice Paradise, MD   Brief Narrative:  Cassandra Terry is a 64 y.o. female with no significant PMH seen in ED for SOB that has been progressively getting worse. she started to have symptoms about 4 week prior to July 4th , where she has sob, fatigue, loss of appetite.Pt reports that today while at her moms pcp  appt she was seen as she was not feeling well and was tested with covid-19 pcr and chest ray was told she has covid-19 and low oxygen and sent to ED. She has not been immunized.  She was hypoxic on arrival. She was started on remdesivir and steroids.  Subjective: Dyspnea and cough is improving.  Waiting to complete her remdesivir before going home.  Still on oxygen.  Assessment & Plan:   Active Problems:   COVID-19   Hypoxia  Acute hypoxic respiratory failure secondary to COVID-19 pneumonia. Chest x-ray done this morning with bilateral opacities consistent with COVID-19 pneumonia.  Patient was saturating in mid 90s on 4 L.  Inflammatory markers elevated.  Procalcitonin negative. -Continue remdesivir-day 4. -Continue Decadron-day 4 -Continue supportive care with supplements and inhalers. -Try weaning her off from oxygen. -Continue to monitor inflammatory markers- improving now.  Hyperglycemia without the diagnosis of diabetes.  Blood glucose in 200s.  No prior diagnosis of diabetes.  A1c checked and it was 6.4 which makes her prediabetic.  Patient is on steroid. -Continue SSI  Objective: Vitals:   08/18/19 2035 08/19/19 0100 08/19/19 0833 08/19/19 1100  BP: 125/71 (!) 134/57 123/75 122/63  Pulse: 73 72 70 69  Resp: 17  16 20   Temp: 98.3 F (36.8 C)  97.6 F (36.4 C) 98.4 F (36.9 C)  TempSrc:   Oral Oral  SpO2: 93% 92% 92% 91%  Weight:      Height:        Intake/Output Summary (Last 24 hours) at 08/19/2019 1515 Last data filed at 08/18/2019 2200 Gross per 24 hour  Intake 700  ml  Output --  Net 700 ml   Filed Weights   08/16/19 1810  Weight: 84.4 kg    Examination:  General exam: Well-developed lady, in no acute distress. Respiratory system: Clear bilaterally, respiratory effort normal. Cardiovascular system: S1 & S2 heard, RRR.  Gastrointestinal system: Soft, nontender, nondistended, bowel sounds positive. Central nervous system: Alert and oriented. No focal neurological deficits. Extremities: No edema, no cyanosis, pulses intact and symmetrical. Psychiatry: Judgement and insight appear normal. Mood & affect appropriate.    DVT prophylaxis: Lovenox Code Status: Full Family Communication: No family at bedside Disposition Plan:  Status is: Inpatient  Remains inpatient appropriate because:Inpatient level of care appropriate due to severity of illness   Dispo: The patient is from: Home              Anticipated d/c is to: Home              Anticipated d/c date is: 1 day.              Patient currently is not medically stable to d/c.  Patient will complete her remdesivir course on Saturday 08/20/19.  Patient is not interested in outpatient completion of therapy.  Consultants:   None  Procedures:  Antimicrobials:   Data Reviewed: I have personally reviewed following labs and imaging studies  CBC: Recent Labs  Lab 08/16/19 2312  WBC 7.1  HGB 12.6  HCT  37.5  MCV 83.3  PLT 372   Basic Metabolic Panel: Recent Labs  Lab 08/16/19 2312 08/17/19 0437 08/18/19 0535 08/19/19 0458  NA 135 135 136 138  K 3.5 3.8 3.8 4.2  CL 101 100 103 104  CO2 25 24 24 27   GLUCOSE 170* 217* 201* 227*  BUN 14 17 19 22   CREATININE 0.79  0.77 0.78 0.70 0.64  CALCIUM 8.3* 8.3* 8.4* 8.2*  MG  --  2.0 2.1 2.1  PHOS  --  4.3 3.1 3.3   GFR: Estimated Creatinine Clearance: 73.7 mL/min (by C-G formula based on SCr of 0.64 mg/dL). Liver Function Tests: Recent Labs  Lab 08/17/19 0437 08/18/19 0535 08/19/19 0458  AST 32 23 23  ALT 33 30 28  ALKPHOS 68 59  55  BILITOT 0.8 0.6 0.7  PROT 7.2 6.6 6.4*  ALBUMIN 3.1* 2.8* 2.8*   No results for input(s): LIPASE, AMYLASE in the last 168 hours. No results for input(s): AMMONIA in the last 168 hours. Coagulation Profile: No results for input(s): INR, PROTIME in the last 168 hours. Cardiac Enzymes: No results for input(s): CKTOTAL, CKMB, CKMBINDEX, TROPONINI in the last 168 hours. BNP (last 3 results) No results for input(s): PROBNP in the last 8760 hours. HbA1C: Recent Labs    08/17/19 0437  HGBA1C 6.4*   CBG: Recent Labs  Lab 08/18/19 1253 08/18/19 1713 08/18/19 2032 08/19/19 1015 08/19/19 1317  GLUCAP 151* 115* 121* 240* 187*   Lipid Profile: No results for input(s): CHOL, HDL, LDLCALC, TRIG, CHOLHDL, LDLDIRECT in the last 72 hours. Thyroid Function Tests: No results for input(s): TSH, T4TOTAL, FREET4, T3FREE, THYROIDAB in the last 72 hours. Anemia Panel: Recent Labs    08/18/19 0535 08/19/19 0458  FERRITIN 221 174   Sepsis Labs: Recent Labs  Lab 08/16/19 1951  PROCALCITON <0.10    Recent Results (from the past 240 hour(s))  SARS Coronavirus 2 by RT PCR (hospital order, performed in [facility] hospital lab) Nasopharyngeal Nasopharyngeal Swab     Status: Abnormal   Collection Time: 08/16/19 11:12 PM   Specimen: Nasopharyngeal Swab  Result Value Ref Range Status   SARS Coronavirus 2 POSITIVE (A) NEGATIVE Final    Comment: CRITICAL RESULT CALLED TO, READ BACK BY AND VERIFIED WITH: [facility] @ 0216 ON 7/142021 RH (NOTE) SARS-CoV-2 target nucleic acids are DETECTED  SARS-CoV-2 RNA is generally detectable in upper respiratory specimens  during the acute phase of infection.  Positive results are indicative  of the presence of the identified virus, but do not rule out bacterial infection or co-infection with other pathogens not detected by the test.  Clinical correlation with patient history and  other diagnostic information is necessary to determine  patient infection status.  The expected result is negative.  Fact Sheet for Patients:   Victorino Sparrow   Fact Sheet for Healthcare Providers:   10-01-1986    This test is not yet approved or cleared by the [email or web address] FDA and  has been authorized for detection and/or diagnosis of SARS-CoV-2 by FDA under an Emergency Use Authorization (EUA).  This EUA will remain in effect (meani ng this test can be used) for the duration of  the COVID-19 declaration under Section 564(b)(1) of the Act, 21 U.S.C. section 360-bbb-3(b)(1), unless the authorization is terminated or revoked sooner.  Performed at [facility], [address] [place], [address] [place]      Radiology Studies: No results found.  Scheduled Meds: . albuterol  2 puff  Inhalation Q6H  . vitamin C  500 mg Oral Daily  . aspirin EC  81 mg Oral Daily  . dexamethasone (DECADRON) injection  6 mg Intravenous Q24H  . enoxaparin (LOVENOX) injection  40 mg Subcutaneous Q24H  . insulin aspart  0-15 Units Subcutaneous TID WC  . insulin aspart  0-5 Units Subcutaneous QHS  . sodium chloride flush  3 mL Intravenous Q12H  . zinc sulfate  220 mg Oral Daily   Continuous Infusions: . sodium chloride Stopped (08/17/19 0500)  . remdesivir 100 mg in NS 100 mL 100 mg (08/19/19 1021)     LOS: 3 days   Time spent: 30 minutes.  Arnetha Courser, MD [facility]  If 7PM-7AM, please contact night-coverage [email or web address]  08/19/2019, 3:15 PM   This record has been created using Conservation officer, historic buildings. Errors have been sought and corrected,but may not always be located. Such creation errors do not reflect on the standard of care.

## 2019-08-20 LAB — COMPREHENSIVE METABOLIC PANEL
ALT: 30 U/L (ref 0–44)
AST: 25 U/L (ref 15–41)
Albumin: 3 g/dL — ABNORMAL LOW (ref 3.5–5.0)
Alkaline Phosphatase: 50 U/L (ref 38–126)
Anion gap: 10 (ref 5–15)
BUN: 23 mg/dL (ref 8–23)
CO2: 24 mmol/L (ref 22–32)
Calcium: 8.3 mg/dL — ABNORMAL LOW (ref 8.9–10.3)
Chloride: 105 mmol/L (ref 98–111)
Creatinine, Ser: 0.75 mg/dL (ref 0.44–1.00)
GFR calc Af Amer: >60 mL/min (ref >60)
GFR calc non Af Amer: >60 mL/min (ref >60)
Glucose, Bld: 225 mg/dL — ABNORMAL HIGH (ref 70–99)
Potassium: 4.1 mmol/L (ref 3.5–5.1)
Sodium: 139 mmol/L (ref 135–145)
Total Bilirubin: 0.5 mg/dL (ref 0.3–1.2)
Total Protein: 6.5 g/dL (ref 6.5–8.1)

## 2019-08-20 LAB — GLUCOSE, CAPILLARY
Glucose-Capillary: 193 mg/dL — ABNORMAL HIGH (ref 70–99)
Glucose-Capillary: 121 mg/dL — ABNORMAL HIGH (ref 70–99)

## 2019-08-20 LAB — C-REACTIVE PROTEIN: CRP: 0.8 mg/dL (ref <1.0)

## 2019-08-20 LAB — FIBRIN DERIVATIVES D-DIMER (ARMC ONLY): Fibrin derivatives D-dimer (ARMC): 468.63 ng/mL (FEU) (ref 0.00–499.00)

## 2019-08-20 LAB — PHOSPHORUS: Phosphorus: 3.2 mg/dL (ref 2.5–4.6)

## 2019-08-20 LAB — MAGNESIUM: Magnesium: 2.2 mg/dL (ref 1.7–2.4)

## 2019-08-20 LAB — FERRITIN: Ferritin: 158 ng/mL (ref 11–307)

## 2019-08-20 MED ORDER — ASCORBIC ACID 500 MG PO TABS: 500.0000 mg | ORAL_TABLET | Freq: Every day | ORAL | 0 refills | Status: AC

## 2019-08-20 MED ORDER — GUAIFENESIN-DM 100-10 MG/5ML PO SYRP: 10.0000 mL | ORAL_SOLUTION | ORAL | 0 refills | Status: AC | PRN

## 2019-08-20 MED ORDER — ASPIRIN 81 MG PO TBEC: 81.0000 mg | DELAYED_RELEASE_TABLET | Freq: Every day | ORAL | 11 refills | Status: AC

## 2019-08-20 MED ORDER — ALBUTEROL SULFATE HFA 108 (90 BASE) MCG/ACT IN AERS: 2.0000 | INHALATION_SPRAY | Freq: Four times a day (QID) | RESPIRATORY_TRACT | 1 refills | Status: AC

## 2019-08-20 MED ORDER — ZINC SULFATE 220 (50 ZN) MG PO CAPS: 220.0000 mg | ORAL_CAPSULE | Freq: Every day | ORAL | 0 refills | Status: AC

## 2019-08-20 MED ORDER — DEXAMETHASONE 6 MG PO TABS
6.0000 mg | ORAL_TABLET | Freq: Every day | ORAL | 0 refills | Status: AC
Start: 2019-08-20 — End: 2019-08-25

## 2019-08-20 NOTE — Progress Notes (Signed)
SATURATION QUALIFICATIONS: (This note is used to comply with regulatory documentation for home oxygen)  Patient Saturations on Room Air at Rest = 94%  Patient Saturations on Room Air while Ambulating = 90%  Patient Saturations on 0 Liters of oxygen while Ambulating = 90%  Please briefly explain why patient needs home oxygen: Patient walking on room air saturates 90% and is in no distress. Does not require home oxygen.

## 2019-08-20 NOTE — Discharge Summary (Signed)
Physician Discharge Summary  Cassandra Terry:[id number] DOB: 1955/08/08 DOA: 08/16/2019  PCP: Cassandra Paradise, MD  Admit date: 08/16/2019 Discharge date: 08/20/2019  Admitted From: Home Disposition:  Home  Recommendations for Outpatient Follow-up:  1. Follow up with PCP in 1-2 weeks 2. Please obtain BMP/CBC in one week 3. Please follow up on the following pending results:None  Home Health:No Equipment/Devices:None Discharge Condition: Stable CODE STATUS: Full Diet recommendation: Heart Healthy / Carb Modified   Brief/Interim Summary: Cassandra Nicks Bosticis a 64 y.o.femalewithno significant PMH seen in ED for SOB that has been progressively getting worse. she started to have symptoms about 4 week prior to July 4th , where she has sob, fatigue, loss of appetite.Pt reports that today while at her moms pcp appt she was seen as she was not feeling well and was tested with covid-19 pcr and chest ray was told she has covid-19 and low oxygen and sent to ED. She has not been immunized.  She was hypoxic on arrival.  2 markers were elevated which started trending down before discharge.  She was weaned off from oxygen.  She was saturating between 90 to 92% on room air. Chest x-ray with bilateral opacities consistent with COVID-19 pneumonia. She was started on remdesivir and steroids, completed a 5-day course of remdesivir and was discharged home for 5 more days of steroid. She will need a repeat chest x-ray in 4 to 6 weeks to see the resolution of her pneumonia.  She was having elevated blood glucose level while in the hospital, can be due to steroid.  A1c was checked and it was 6.4 which makes her prediabetic.  She was advised to watch her diet and follow-up closely with primary care physician for further management.  Her blood pressure was well-controlled during hospitalization without any intervention.  She can resume her home dose of losartan.  She was also asked to discontinue Maxide and  follow-up with primary care physician who can restart if needed.  Discharge Diagnoses:  Active Problems:   COVID-19   Hypoxia  Discharge Instructions  Discharge Instructions    Diet - low sodium heart healthy   Complete by: As directed    Discharge instructions   Complete by: As directed    It was pleasure taking care of you. Please keep taking your steroid for another 5 more days.  Your A1c was in prediabetic range.  And steroid is increasing your blood sugar.  Please keep those things in mind and modify your diet and follow-up with your primary care provider for further management. Please have your primary care physician repeat your chest x-ray in 4 to 6 weeks to see the resolution of infection. Please keep your self quarantine for another 2 weeks. Keep yourself well-hydrated and slowly resume your normal activity. I am discontinuing your Maxide at this time as your blood pressure was within normal range without any medication.  You can resume your losartan and follow-up with your primary care provider who can restart if needed.   Increase activity slowly   Complete by: As directed    MyChart COVID-19 home monitoring program   Complete by: Aug 20, 2019    Is the patient willing to use the MyChart Mobile App for home monitoring?: Yes   Temperature monitoring   Complete by: Aug 20, 2019    After how many days would you like to receive a notification of this patient's flowsheet entries?: 1     Allergies as of 08/20/2019  No Known Allergies     Medication List    STOP taking these medications   acyclovir 400 MG tablet Commonly known as: ZOVIRAX   levofloxacin 500 MG tablet Commonly known as: LEVAQUIN   predniSONE 10 MG tablet Commonly known as: DELTASONE   triamterene-hydrochlorothiazide 37.5-25 MG tablet Commonly known as: MAXZIDE-25     TAKE these medications   albuterol 108 (90 Base) MCG/ACT inhaler Commonly known as: VENTOLIN HFA Inhale 2 puffs into the lungs  every 6 (six) hours.   ascorbic acid 500 MG tablet Commonly known as: VITAMIN C Take 1 tablet (500 mg total) by mouth daily. Start taking on: August 21, 2019   aspirin 81 MG EC tablet Take 1 tablet (81 mg total) by mouth daily. Swallow whole. Start taking on: August 21, 2019   calcium-vitamin D 500-200 MG-UNIT Tabs tablet Commonly known as: OSCAL WITH D Take 1 tablet by mouth 2 (two) times daily.   clindamycin 1 % gel Commonly known as: CLINDAGEL Apply 1 application topically 2 (two) times daily.   cyanocobalamin 1000 MCG tablet Take 2,500 mcg by mouth daily.   dexamethasone 6 MG tablet Commonly known as: DECADRON Take 1 tablet (6 mg total) by mouth daily for 5 days.   diclofenac Sodium 1 % Gel Commonly known as: VOLTAREN Apply 2 g topically 4 (four) times daily.   guaiFENesin-dextromethorphan 100-10 MG/5ML syrup Commonly known as: ROBITUSSIN DM Take 10 mLs by mouth every 4 (four) hours as needed for cough.   losartan 100 MG tablet Commonly known as: COZAAR Take 100 mg by mouth daily.   Multi-Vitamin tablet Take 1 tablet by mouth daily.   rosuvastatin 10 MG tablet Commonly known as: CRESTOR Take 10 mg by mouth daily.   zinc sulfate 220 (50 Zn) MG capsule Take 1 capsule (220 mg total) by mouth daily. Start taking on: August 21, 2019       Follow-up Information    Cassandra Paradise, MD. Schedule an appointment as soon as possible for a visit.   Specialty: Physician Assistant Contact information: [phone] [facility] [address] [facility] [place] [address] [phone]              No Known Allergies  Consultations:  None  Procedures/Studies: DG Chest Port 1 View  Result Date: 08/17/2019 CLINICAL DATA:  Hypoxia, COVID positive EXAM: PORTABLE CHEST 1 VIEW COMPARISON:  2007 FINDINGS: Patchy bilateral opacities. No pleural effusion or pneumothorax. Normal heart size. IMPRESSION: Patchy bilateral opacities likely reflecting COVID-19 pneumonia.  Electronically Signed   By: Guadlupe Spanish M.D.   On: 08/17/2019 09:13     Subjective: Patient has no new complaints today.  She was ready to go home.  Discharge Exam: Vitals:   08/20/19 0442 08/20/19 0907  BP: (!) 144/63 (!) 142/74  Pulse: 71 70  Resp: 17 16  Temp: 97.6 F (36.4 C) 98.6 F (37 C)  SpO2: 93% 96%   Vitals:   08/19/19 2055 08/20/19 0136 08/20/19 0442 08/20/19 0907  BP: 136/73 127/65 (!) 144/63 (!) 142/74  Pulse: 68 67 71 70  Resp: 20 20 17 16   Temp: 98 F (36.7 C) 97.9 F (36.6 C) 97.6 F (36.4 C) 98.6 F (37 C)  TempSrc: Oral Oral Oral Oral  SpO2: 95% 95% 93% 96%  Weight:      Height:        General: Pt is alert, awake, not in acute distress Cardiovascular: RRR, S1/S2 +, no rubs, no gallops Respiratory: CTA bilaterally, no  wheezing, no rhonchi Abdominal: Soft, NT, ND, bowel sounds + Extremities: no edema, no cyanosis   The results of significant diagnostics from this hospitalization (including imaging, microbiology, ancillary and laboratory) are listed below for reference.    Microbiology: Recent Results (from the past 240 hour(s))  SARS Coronavirus 2 by RT PCR (hospital order, [facility]) Nasopharyngeal Nasopharyngeal Swab     Status: Abnormal   Collection Time: 08/16/19 11:12 PM   Specimen: Nasopharyngeal Swab  Result Value Ref Range Status   SARS Coronavirus 2 POSITIVE (A) NEGATIVE Final    Comment: CRITICAL RESULT CALLED TO, READ BACK BY AND VERIFIED WITH: Victorino SparrowKIERRA TORAIN @ 0216 ON 7/142021 RH (NOTE) SARS-CoV-2 target nucleic acids are DETECTED  SARS-CoV-2 RNA is generally detectable in upper respiratory specimens  during the acute phase of infection.  Positive results are indicative  of the presence of the identified virus, but do not rule out bacterial infection or co-infection with other pathogens not detected by the test.  Clinical correlation with patient history and  other diagnostic information is  necessary to determine patient infection status.  The expected result is negative.  Fact Sheet for Patients:   BoilerBrush.com.cyhttps://www.fda.gov/media/136312/download   Fact Sheet for Healthcare Providers:   https://pope.com/https://www.fda.gov/media/136313/download    This test is not yet approved or cleared by the [place] FDA and  has been authorized for detection and/or diagnosis of SARS-CoV-2 by FDA under an Emergency Use Authorization (EUA).  This EUA will remain in effect (meani ng this test can be used) for the duration of  the COVID-19 declaration under Section 564(b)(1) of the Act, 21 U.S.C. section 360-bbb-3(b)(1), unless the authorization is terminated or revoked sooner.  Performed at [facility], [address] [place], [place] [id number]      Labs: BNP (last 3 results) Recent Labs    08/16/19 2312  BNP 43.5   Basic Metabolic Panel: Recent Labs  Lab 08/16/19 2312 08/17/19 0437 08/18/19 0535 08/19/19 0458 08/20/19 0306  NA 135 135 136 138 139  K 3.5 3.8 3.8 4.2 4.1  CL 101 100 103 104 105  CO2 25 24 24 27 24   GLUCOSE 170* 217* 201* 227* 225*  BUN 14 17 19 22 23   CREATININE 0.79  0.77 0.78 0.70 0.64 0.75  CALCIUM 8.3* 8.3* 8.4* 8.2* 8.3*  MG  --  2.0 2.1 2.1 2.2  PHOS  --  4.3 3.1 3.3 3.2   Liver Function Tests: Recent Labs  Lab 08/17/19 0437 08/18/19 0535 08/19/19 0458 08/20/19 0306  AST 32 23 23 25   ALT 33 30 28 30   ALKPHOS 68 59 55 50  BILITOT 0.8 0.6 0.7 0.5  PROT 7.2 6.6 6.4* 6.5  ALBUMIN 3.1* 2.8* 2.8* 3.0*   No results for input(s): LIPASE, AMYLASE in the last 168 hours. No results for input(s): AMMONIA in the last 168 hours. CBC: Recent Labs  Lab 08/16/19 2312  WBC 7.1  HGB 12.6  HCT 37.5  MCV 83.3  PLT 372   Cardiac Enzymes: No results for input(s): CKTOTAL, CKMB, CKMBINDEX, TROPONINI in the last 168 hours. BNP: Invalid input(s): POCBNP CBG: Recent Labs  Lab 08/19/19 1015 08/19/19 1317 08/19/19 1751 08/19/19 2056 08/20/19 0945   GLUCAP 240* 187* 108* 129* 193*   D-Dimer No results for input(s): DDIMER in the last 72 hours. Hgb A1c No results for input(s): HGBA1C in the last 72 hours. Lipid Profile No results for input(s): CHOL, HDL, LDLCALC, TRIG, CHOLHDL, LDLDIRECT in the  last 72 hours. Thyroid function studies No results for input(s): TSH, T4TOTAL, T3FREE, THYROIDAB in the last 72 hours.  Invalid input(s): FREET3 Anemia work up [employer]    08/19/19 0458 08/20/19 0306  FERRITIN 174 158   Urinalysis No results found for: COLORURINE, APPEARANCEUR, LABSPEC, PHURINE, GLUCOSEU, HGBUR, BILIRUBINUR, KETONESUR, PROTEINUR, UROBILINOGEN, NITRITE, LEUKOCYTESUR Sepsis Labs Invalid input(s): PROCALCITONIN,  WBC,  LACTICIDVEN Microbiology Recent Results (from the past 240 hour(s))  SARS Coronavirus 2 by RT PCR (hospital order, performed in [facility] hospital lab) Nasopharyngeal Nasopharyngeal Swab     Status: Abnormal   Collection Time: 08/16/19 11:12 PM   Specimen: Nasopharyngeal Swab  Result Value Ref Range Status   SARS Coronavirus 2 POSITIVE (A) NEGATIVE Final    Comment: CRITICAL RESULT CALLED TO, READ BACK BY AND VERIFIED WITH: Victorino Sparrow @ 0216 ON 7/142021 RH (NOTE) SARS-CoV-2 target nucleic acids are DETECTED  SARS-CoV-2 RNA is generally detectable in upper respiratory specimens  during the acute phase of infection.  Positive results are indicative  of the presence of the identified virus, but do not rule out bacterial infection or co-infection with other pathogens not detected by the test.  Clinical correlation with patient history and  other diagnostic information is necessary to determine patient infection status.  The expected result is negative.  Fact Sheet for Patients:   BoilerBrush.com.cy   Fact Sheet for Healthcare Providers:   https://pope.com/    This test is not yet approved or cleared by the [place] FDA and  has been  authorized for detection and/or diagnosis of SARS-CoV-2 by FDA under an Emergency Use Authorization (EUA).  This EUA will remain in effect (meani ng this test can be used) for the duration of  the COVID-19 declaration under Section 564(b)(1) of the Act, 21 U.S.C. section 360-bbb-3(b)(1), unless the authorization is terminated or revoked sooner.  Performed at [facility], [address] [place], [place] [address]     Time coordinating discharge: Over 30 minutes  SIGNED:  Arnetha Courser, MD  [facility] 08/20/2019, 1:39 PM  If 7PM-7AM, please contact night-coverage [email or web address]  This record has been created using Conservation officer, historic buildings. Errors have been sought and corrected,but may not always be located. Such creation errors do not reflect on the standard of care.

## 2020-03-23 ENCOUNTER — Other Ambulatory Visit: Payer: Self-pay | Admitting: Physician Assistant

## 2020-03-23 DIAGNOSIS — Z1231 Encounter for screening mammogram for malignant neoplasm of breast: Secondary | ICD-10-CM

## 2020-04-23 ENCOUNTER — Other Ambulatory Visit: Payer: Self-pay

## 2020-04-23 ENCOUNTER — Ambulatory Visit
Admission: RE | Admit: 2020-04-23 | Discharge: 2020-04-23 | Disposition: A | Discharge status: Home / Self Care | Payer: Medicare PPO | Source: Ambulatory Visit | Attending: Physician Assistant | Admitting: Physician Assistant

## 2020-04-23 DIAGNOSIS — Z1231 Encounter for screening mammogram for malignant neoplasm of breast: Secondary | ICD-10-CM | POA: Insufficient documentation

## 2021-03-27 ENCOUNTER — Other Ambulatory Visit: Payer: Self-pay | Admitting: Physician Assistant

## 2021-03-27 DIAGNOSIS — Z1231 Encounter for screening mammogram for malignant neoplasm of breast: Secondary | ICD-10-CM

## 2021-05-03 ENCOUNTER — Ambulatory Visit
Admission: RE | Admit: 2021-05-03 | Discharge: 2021-05-03 | Disposition: A | Discharge status: Home / Self Care | Payer: Medicare PPO | Source: Ambulatory Visit | Attending: Physician Assistant | Admitting: Physician Assistant

## 2021-05-03 DIAGNOSIS — Z1231 Encounter for screening mammogram for malignant neoplasm of breast: Secondary | ICD-10-CM | POA: Insufficient documentation

## 2022-04-04 ENCOUNTER — Other Ambulatory Visit: Payer: Self-pay | Admitting: Physician Assistant

## 2022-04-04 DIAGNOSIS — Z1231 Encounter for screening mammogram for malignant neoplasm of breast: Secondary | ICD-10-CM

## 2022-05-08 ENCOUNTER — Ambulatory Visit
Admission: RE | Admit: 2022-05-08 | Discharge: 2022-05-08 | Disposition: A | Discharge status: Home / Self Care | Payer: Medicare PPO | Source: Ambulatory Visit | Attending: Physician Assistant | Admitting: Physician Assistant

## 2022-05-08 DIAGNOSIS — Z1231 Encounter for screening mammogram for malignant neoplasm of breast: Secondary | ICD-10-CM | POA: Diagnosis present

## 2023-04-08 ENCOUNTER — Other Ambulatory Visit: Payer: Self-pay | Admitting: Physician Assistant

## 2023-04-08 DIAGNOSIS — Z1231 Encounter for screening mammogram for malignant neoplasm of breast: Secondary | ICD-10-CM

## 2023-05-21 ENCOUNTER — Ambulatory Visit
Admission: RE | Admit: 2023-05-21 | Discharge: 2023-05-21 | Disposition: A | Discharge status: Home / Self Care | Source: Ambulatory Visit | Attending: Physician Assistant | Admitting: Physician Assistant

## 2023-05-21 DIAGNOSIS — Z1231 Encounter for screening mammogram for malignant neoplasm of breast: Secondary | ICD-10-CM | POA: Insufficient documentation

## 2024-02-12 ENCOUNTER — Encounter

## 2024-04-01 ENCOUNTER — Encounter
